# Patient Record
Sex: Male | Born: 1985 | Race: White | Hispanic: No | Marital: Single | Smoking: Current every day smoker
Health system: Southern US, Community
[De-identification: ages and names within clinical notes are randomized; demographics above are authoritative.]

## PROBLEM LIST (undated history)

## (undated) DIAGNOSIS — I10 Essential (primary) hypertension: Secondary | ICD-10-CM

---

## 2003-01-21 ENCOUNTER — Emergency Department (HOSPITAL_COMMUNITY): Admission: EM | Admit: 2003-01-21 | Discharge: 2003-01-21 | Payer: Self-pay | Admitting: Emergency Medicine

## 2003-01-28 ENCOUNTER — Emergency Department (HOSPITAL_COMMUNITY): Admission: EM | Admit: 2003-01-28 | Discharge: 2003-01-28 | Payer: Self-pay | Admitting: Emergency Medicine

## 2004-03-19 ENCOUNTER — Ambulatory Visit (HOSPITAL_COMMUNITY): Admission: RE | Admit: 2004-03-19 | Discharge: 2004-03-19 | Payer: Self-pay | Admitting: Specialist

## 2005-01-01 ENCOUNTER — Ambulatory Visit (HOSPITAL_COMMUNITY): Admission: RE | Admit: 2005-01-01 | Discharge: 2005-01-01 | Payer: Self-pay | Admitting: Specialist

## 2009-01-06 ENCOUNTER — Encounter: Admission: RE | Admit: 2009-01-06 | Discharge: 2009-01-06 | Payer: Self-pay | Admitting: Otolaryngology

## 2011-01-15 ENCOUNTER — Other Ambulatory Visit: Payer: Self-pay | Admitting: Family Medicine

## 2011-01-15 DIAGNOSIS — G43909 Migraine, unspecified, not intractable, without status migrainosus: Secondary | ICD-10-CM

## 2011-01-22 ENCOUNTER — Ambulatory Visit
Admission: RE | Admit: 2011-01-22 | Discharge: 2011-01-22 | Disposition: A | Discharge status: Home / Self Care | Payer: Self-pay | Source: Ambulatory Visit | Attending: Family Medicine | Admitting: Family Medicine

## 2011-01-22 DIAGNOSIS — G43909 Migraine, unspecified, not intractable, without status migrainosus: Secondary | ICD-10-CM

## 2011-02-09 ENCOUNTER — Emergency Department (HOSPITAL_COMMUNITY)
Admission: EM | Admit: 2011-02-09 | Discharge: 2011-02-10 | Disposition: A | Discharge status: Home / Self Care | Payer: 59 | Attending: Emergency Medicine | Admitting: Emergency Medicine

## 2011-02-09 ENCOUNTER — Encounter: Payer: Self-pay | Admitting: Adult Health

## 2011-02-09 ENCOUNTER — Other Ambulatory Visit: Payer: Self-pay

## 2011-02-09 DIAGNOSIS — F17200 Nicotine dependence, unspecified, uncomplicated: Secondary | ICD-10-CM | POA: Insufficient documentation

## 2011-02-09 DIAGNOSIS — R51 Headache: Secondary | ICD-10-CM | POA: Insufficient documentation

## 2011-02-09 DIAGNOSIS — D72829 Elevated white blood cell count, unspecified: Secondary | ICD-10-CM | POA: Insufficient documentation

## 2011-02-09 DIAGNOSIS — R599 Enlarged lymph nodes, unspecified: Secondary | ICD-10-CM | POA: Insufficient documentation

## 2011-02-09 DIAGNOSIS — R591 Generalized enlarged lymph nodes: Secondary | ICD-10-CM

## 2011-02-09 LAB — DIFFERENTIAL
Band Neutrophils: 0 % (ref 0–10)
Basophils Absolute: 1 10*3/uL — ABNORMAL HIGH (ref 0.0–0.1)
Basophils Relative: 5 % — ABNORMAL HIGH (ref 0–1)
Eosinophils Absolute: 0 10*3/uL (ref 0.0–0.7)
Eosinophils Relative: 0 % (ref 0–5)
Lymphocytes Relative: 64 % — ABNORMAL HIGH (ref 12–46)
Lymphs Abs: 13.1 10*3/uL — ABNORMAL HIGH (ref 0.7–4.0)
Metamyelocytes Relative: 1 %
Monocytes Relative: 16 % — ABNORMAL HIGH (ref 3–12)
Myelocytes: 0 %
Neutro Abs: 3.1 10*3/uL (ref 1.7–7.7)
Neutrophils Relative %: 14 % — ABNORMAL LOW (ref 43–77)
Promyelocytes Absolute: 0 %

## 2011-02-09 LAB — CBC
HCT: 46.6 % (ref 39.0–52.0)
Hemoglobin: 15.7 g/dL (ref 13.0–17.0)
MCH: 29.2 pg (ref 26.0–34.0)
MCHC: 33.7 g/dL (ref 30.0–36.0)
MCV: 86.6 fL (ref 78.0–100.0)
RBC: 5.38 MIL/uL (ref 4.22–5.81)
RDW: 14.7 % (ref 11.5–15.5)

## 2011-02-09 LAB — BASIC METABOLIC PANEL
BUN: 8 mg/dL (ref 6–23)
CO2: 25 mEq/L (ref 19–32)
Calcium: 9.1 mg/dL (ref 8.4–10.5)
Chloride: 101 mEq/L (ref 96–112)
Creatinine, Ser: 1 mg/dL (ref 0.50–1.35)
GFR calc Af Amer: >90 mL/min (ref >90)
GFR calc non Af Amer: >90 mL/min (ref >90)
Glucose, Bld: 110 mg/dL — ABNORMAL HIGH (ref 70–99)
Potassium: 4.2 mEq/L (ref 3.5–5.1)
Sodium: 136 mEq/L (ref 135–145)

## 2011-02-09 MED ORDER — DEXAMETHASONE SODIUM PHOSPHATE 10 MG/ML IJ SOLN: 10.0000 mg | Freq: Once | INTRAMUSCULAR | Status: DC

## 2011-02-09 MED ORDER — DEXAMETHASONE SODIUM PHOSPHATE 10 MG/ML IJ SOLN
10.0000 mg | Freq: Once | INTRAMUSCULAR | Status: AC
  Administered 2011-02-10: 10 mg via INTRAMUSCULAR
  Filled 2011-02-09: qty 1

## 2011-02-09 MED ORDER — DIPHENHYDRAMINE HCL 25 MG PO CAPS
25.0000 mg | ORAL_CAPSULE | Freq: Once | ORAL | Status: AC
  Administered 2011-02-10: 25 mg via ORAL
  Filled 2011-02-09: qty 1

## 2011-02-09 NOTE — ED Notes (Signed)
Reports migraine that began 3 weeks ago, Sensitive to light and sound. C/O neck pain.  Pt states, "This migraine does not go away, worse headache of his life. States it eases a bit and then comes back and is the worse headache ever." Alert and oriented answers all questions appropriately, follows commands.

## 2011-02-09 NOTE — ED Notes (Signed)
Pt also c/o dark urine, small bumps on the back of his neck and chest pain on the left side of chest that radiates to the back and is associated with nausea.

## 2011-02-10 ENCOUNTER — Emergency Department (HOSPITAL_COMMUNITY): Payer: 59

## 2011-02-10 ENCOUNTER — Encounter (HOSPITAL_COMMUNITY): Payer: Self-pay | Admitting: Emergency Medicine

## 2011-02-10 LAB — URINALYSIS, ROUTINE W REFLEX MICROSCOPIC
Hgb urine dipstick: NEGATIVE
Nitrite: POSITIVE — AB
Protein, ur: NEGATIVE mg/dL
Specific Gravity, Urine: 1.026 (ref 1.005–1.030)
Urobilinogen, UA: 2 mg/dL — ABNORMAL HIGH (ref 0.0–1.0)

## 2011-02-10 LAB — URINE MICROSCOPIC-ADD ON

## 2011-02-10 MED ORDER — SODIUM CHLORIDE 0.9 % IV BOLUS (SEPSIS)
1000.0000 mL | Freq: Once | INTRAVENOUS | Status: AC
  Administered 2011-02-10: 1000 mL via INTRAVENOUS

## 2011-02-10 MED ORDER — ONDANSETRON HCL 4 MG/2ML IJ SOLN
4.0000 mg | Freq: Once | INTRAMUSCULAR | Status: AC
  Administered 2011-02-10: 4 mg via INTRAVENOUS
  Filled 2011-02-10: qty 2

## 2011-02-10 MED ORDER — HYDROMORPHONE HCL PF 1 MG/ML IJ SOLN
1.0000 mg | Freq: Once | INTRAMUSCULAR | Status: AC
  Administered 2011-02-10: 1 mg via INTRAVENOUS
  Filled 2011-02-10: qty 1

## 2011-02-10 MED ORDER — METOCLOPRAMIDE HCL 5 MG/ML IJ SOLN
10.0000 mg | Freq: Once | INTRAMUSCULAR | Status: AC
  Administered 2011-02-10: 10 mg via INTRAVENOUS
  Filled 2011-02-10: qty 2

## 2011-02-10 NOTE — ED Provider Notes (Signed)
History     CSN: [id number] Arrival date & time: 02/09/2011  8:47 PM   First MD Initiated Contact with Patient 02/10/11 0033      Chief Complaint  Patient presents with  . Migraine    Patient is a 25 y.o. male presenting with migraine.  Migraine Associated symptoms include coughing, fatigue and headaches. Pertinent negatives include no abdominal pain, chest pain, chills, nausea, numbness, rash or vomiting.   History provided by patient and family. Patient presents with complaints of persistent migraine headache for the past 3 weeks. Patient has history of migraines and normally takes atenolol and Flexeril for this. Patient states he has been taking this regularly without improvement of symptoms. Pain is worse with bright lights and loud noise. Patient also reports feeling generalized fatigue and flulike symptoms. Patient reports a mild cough. He also reports having some bumps under the skin to the posterior neck that have been sore. These have been present for the past week. Patient has associated decreased appetite. Patient denies nausea or vomiting. No nasal congestion or sinus pressure. No ear pain or hearing changes.    Past Medical History  Diagnosis Date  . Migraine   . Glaucoma     History reviewed. No pertinent past surgical history.  History reviewed. No pertinent family history.  History  Substance Use Topics  . Smoking status: Current Everyday Smoker  . Smokeless tobacco: Not on file  . Alcohol Use: No      Review of Systems  Constitutional: Positive for fatigue. Negative for chills.  Respiratory: Positive for cough.   Cardiovascular: Negative for chest pain.  Gastrointestinal: Negative for nausea, vomiting, abdominal pain, diarrhea and constipation.  Skin: Negative for rash.  Neurological: Positive for headaches. Negative for dizziness and numbness.  Psychiatric/Behavioral: Negative for confusion.  All other systems reviewed and are  negative.    Allergies  Review of patient's allergies indicates no known allergies.  Home Medications   Current Outpatient Rx  Name Route Sig Dispense Refill  . ATENOLOL 25 MG PO TABS Oral Take 25 mg by mouth 2 (two) times daily.      . CYCLOBENZAPRINE HCL 10 MG PO TABS Oral Take 10 mg by mouth 3 (three) times daily as needed. Pain.       BP 114/65  Pulse 86  Temp(Src) 99 F (37.2 C) (Oral)  Resp 16  SpO2 98%  Physical Exam  Nursing note and vitals reviewed. Constitutional: He is oriented to person, place, and time. He appears well-developed and well-nourished. No distress.       Uncomfortable appearing  HENT:  Head: Normocephalic and atraumatic.  Right Ear: External ear normal.  Left Ear: External ear normal.  Mouth/Throat: Oropharynx is clear and moist.  Eyes: Conjunctivae and EOM are normal. Pupils are equal, round, and reactive to light. No scleral icterus.  Neck: Full passive range of motion without pain. No erythema present. No Brudzinski's sign and no Kernig's sign noted.       Bilateral posterior chain cervical adenopathy with mild tenderness  Cardiovascular: Normal rate and regular rhythm.   No murmur heard. Pulmonary/Chest: Effort normal and breath sounds normal. He has no wheezes. He has no rales.  Abdominal: Soft. Bowel sounds are normal. There is no tenderness. There is CVA tenderness. There is no rebound and no guarding.  Musculoskeletal: Normal range of motion. He exhibits no edema and no tenderness.  Neurological: He is alert and oriented to person, place, and time. He has normal strength. No  cranial nerve deficit or sensory deficit. Coordination and gait normal.  Skin: Skin is warm. No rash noted. No erythema.  Psychiatric: He has a normal mood and affect.    ED Course  Procedures (including critical care time)  Labs Reviewed  CBC - Abnormal; Notable for the following:    WBC 20.5 (*)    All other components within normal limits  DIFFERENTIAL -  Abnormal; Notable for the following:    Neutrophils Relative 14 (*)    Lymphocytes Relative 64 (*)    Monocytes Relative 16 (*)    Basophils Relative 5 (*)    Lymphs Abs 13.1 (*)    Monocytes Absolute 3.3 (*)    Basophils Absolute 1.0 (*)    All other components within normal limits  BASIC METABOLIC PANEL - Abnormal; Notable for the following:    Glucose, Bld 110 (*)    All other components within normal limits  URINALYSIS, ROUTINE W REFLEX MICROSCOPIC - Abnormal; Notable for the following:    Color, Urine ORANGE (*) BIOCHEMICALS MAY BE AFFECTED BY COLOR   Appearance CLOUDY (*)    Bilirubin Urine LARGE (*)    Ketones, ur TRACE (*)    Urobilinogen, UA 2.0 (*)    Nitrite POSITIVE (*)    Leukocytes, UA SMALL (*)    All other components within normal limits  URINE MICROSCOPIC-ADD ON - Abnormal; Notable for the following:    Bacteria, UA MANY (*)    Crystals CA OXALATE CRYSTALS (*)    All other components within normal limits  URINE CULTURE  PATHOLOGIST SMEAR REVIEW   Results for orders placed during the hospital encounter of 02/09/11  CBC      Component Value Range   WBC 20.5 (*) 4.0 - 10.5 (K/uL)   RBC 5.38  4.22 - 5.81 (MIL/uL)   Hemoglobin 15.7  13.0 - 17.0 (g/dL)   HCT 40.9  81.1 - 91.4 (%)   MCV 86.6  78.0 - 100.0 (fL)   MCH 29.2  26.0 - 34.0 (pg)   MCHC 33.7  30.0 - 36.0 (g/dL)   RDW 78.2  95.6 - 21.3 (%)   Platelets 219  150 - 400 (K/uL)  DIFFERENTIAL      Component Value Range   Neutrophils Relative 14 (*) 43 - 77 (%)   Lymphocytes Relative 64 (*) 12 - 46 (%)   Monocytes Relative 16 (*) 3 - 12 (%)   Eosinophils Relative 0  0 - 5 (%)   Basophils Relative 5 (*) 0 - 1 (%)   Band Neutrophils 0  0 - 10 (%)   Metamyelocytes Relative 1     Myelocytes 0     Promyelocytes Absolute 0     Blasts 0     nRBC 0  0 (/100 WBC)   Neutro Abs 3.1  1.7 - 7.7 (K/uL)   Lymphs Abs 13.1 (*) 0.7 - 4.0 (K/uL)   Monocytes Absolute 3.3 (*) 0.1 - 1.0 (K/uL)   Eosinophils Absolute 0.0   0.0 - 0.7 (K/uL)   Basophils Absolute 1.0 (*) 0.0 - 0.1 (K/uL)   WBC Morphology VACUOLATED NEUTROPHILS     Smear Review LARGE PLATELETS PRESENT    BASIC METABOLIC PANEL      Component Value Range   Sodium 136  135 - 145 (mEq/L)   Potassium 4.2  3.5 - 5.1 (mEq/L)   Chloride 101  96 - 112 (mEq/L)   CO2 25  19 - 32 (mEq/L)   Glucose, Bld  110 (*) 70 - 99 (mg/dL)   BUN 8  6 - 23 (mg/dL)   Creatinine, Ser 1.19  0.50 - 1.35 (mg/dL)   Calcium 9.1  8.4 - 14.7 (mg/dL)   GFR calc non Af Amer >90  >90 (mL/min)   GFR calc Af Amer >90  >90 (mL/min)  URINALYSIS, ROUTINE W REFLEX MICROSCOPIC      Component Value Range   Color, Urine ORANGE (*) YELLOW    Appearance CLOUDY (*) CLEAR    Specific Gravity, Urine 1.026  1.005 - 1.030    pH 5.5  5.0 - 8.0    Glucose, UA NEGATIVE  NEGATIVE (mg/dL)   Hgb urine dipstick NEGATIVE  NEGATIVE    Bilirubin Urine LARGE (*) NEGATIVE    Ketones, ur TRACE (*) NEGATIVE (mg/dL)   Protein, ur NEGATIVE  NEGATIVE (mg/dL)   Urobilinogen, UA 2.0 (*) 0.0 - 1.0 (mg/dL)   Nitrite POSITIVE (*) NEGATIVE    Leukocytes, UA SMALL (*) NEGATIVE   URINE MICROSCOPIC-ADD ON      Component Value Range   Squamous Epithelial / LPF RARE  RARE    WBC, UA 3-6  <3 (WBC/hpf)   RBC / HPF 0-2  <3 (RBC/hpf)   Bacteria, UA MANY (*) RARE    Crystals CA OXALATE CRYSTALS (*) NEGATIVE    Urine-Other MUCOUS PRESENT        Ct Abdomen Pelvis Wo Contrast  02/10/2011  *RADIOLOGY REPORT*  Clinical Data: Left flank pain and nausea; fever.  Leukocytosis. White blood cells and red blood cells in the urine.  CT ABDOMEN AND PELVIS WITHOUT CONTRAST  Technique:  Multidetector CT imaging of the abdomen and pelvis was performed following the standard protocol without intravenous contrast.  Comparison: None.  Findings: Bibasilar atelectasis is noted.  The liver is unremarkable in appearance.  The spleen is mildly enlarged, measuring 14.0 cm in length.  The gallbladder is within normal limits.  The pancreas  and right adrenal gland are unremarkable.  There is a 2.0 cm mass arising at the left adrenal gland; its attenuation is too high to characterize as an adrenal adenoma on this study.  Adrenal protocol CT or MRI could be considered for further evaluation.  Minimal mesenteric haziness is noted adjacent to the pancreas; this is nonspecific, as no definite pancreatic inflammation is seen.  The kidneys are unremarkable in appearance.  There is no evidence of hydronephrosis.  No renal or ureteral stones are seen.  No perinephric stranding is appreciated.  No free fluid is identified.  The small bowel is unremarkable in appearance.  The stomach is within normal limits.  No acute vascular abnormalities are seen.  The appendix is normal in caliber, without evidence for appendicitis.  The colon is partially filled with air and stool, and is unremarkable in appearance.  The bladder is mildly distended and within normal limits; there is no definite evidence for cystitis.  The prostate is unremarkable in appearance.  No inguinal lymphadenopathy is seen.  No acute osseous abnormalities are identified.  IMPRESSION:  1.  Kidneys unremarkable in appearance; no evidence of hydronephrosis.  No evidence for pyelonephritis.  Bladder grossly unremarkable in appearance. 2.  Minimal nonspecific mesenteric haziness noted adjacent to the pancreas; no definite pancreatic inflammation seen to suggest pancreatitis. 3.  Mild splenomegaly. 4.  2.0 cm mass noted arising at the left adrenal gland; adrenal protocol CT or MRI would be helpful for further evaluation, when and as deemed clinically appropriate. 5.  Bibasilar atelectasis noted.  Original Report Authenticated By: Tonia Ghent, M.D.   Dg Chest 2 View  02/10/2011  *RADIOLOGY REPORT*  Clinical Data: Fever and cough for 3 weeks.  CHEST - 2 VIEW  Comparison: None.  Findings: The lungs are well-aerated.  Mild bilateral atelectasis is noted, more prominent on the left.  There is no evidence  of pleural effusion or pneumothorax.  The heart is normal in size; the mediastinal contour is within normal limits.  No acute osseous abnormalities are seen.  IMPRESSION: Mild bilateral atelectasis noted, more prominent on the left.  Original Report Authenticated By: Tonia Ghent, M.D.     1. Headache   2. Lymphadenopathy   3. Leukocytosis       MDM  Patient seen and evaluated. He is in no acute distress. Patient with headache symptoms the past 3 weeks. Patient does not appear toxic  Patient seen and discussed with attending physician, Dr. Adriana Simas. Patient with no signs for meningitis. Patient has multiple visits to the ED. Patient reports symptoms are improved. Since labs today do show elevated white blood cell count. Patient's urine was sent for culture. The patient has been instructed to followup with his primary care provider for repeat lab test next week.      Angus Seller, [place] 02/10/11 [phone]

## 2011-02-10 NOTE — ED Provider Notes (Signed)
Medical screening examination/treatment/procedure(s) were conducted as a shared visit with non-physician practitioner(s) and myself.  I personally evaluated the patient during the encounter. No neuro deficits. No meningeal signs.  Lymphocyte predominant leukocytosis noted.  Will culture urine.  Donnetta Hutching, MD 02/10/11 [phone]

## 2011-02-10 NOTE — ED Notes (Signed)
Patient refused rectal temp made MD, and RN aware

## 2011-02-11 LAB — URINE CULTURE
Colony Count: NO GROWTH
Culture  Setup Time: 201211140921

## 2011-02-11 LAB — PATHOLOGIST SMEAR REVIEW

## 2011-10-14 ENCOUNTER — Ambulatory Visit (INDEPENDENT_AMBULATORY_CARE_PROVIDER_SITE_OTHER): Payer: 59 | Admitting: Emergency Medicine

## 2011-10-14 VITALS — BP 110/79 | HR 104 | Temp 98.4°F | Resp 18 | Ht 69.0 in | Wt 201.0 lb

## 2011-10-14 DIAGNOSIS — R1110 Vomiting, unspecified: Secondary | ICD-10-CM

## 2011-10-14 DIAGNOSIS — R197 Diarrhea, unspecified: Secondary | ICD-10-CM

## 2011-10-14 DIAGNOSIS — R109 Unspecified abdominal pain: Secondary | ICD-10-CM

## 2011-10-14 LAB — POCT CBC
Granulocyte percent: 83.3 %G — AB (ref 37–80)
HCT, POC: 50.8 % (ref 43.5–53.7)
Hemoglobin: 15.9 g/dL (ref 14.1–18.1)
Lymph, poc: 1.3 (ref 0.6–3.4)
MCH, POC: 28.2 pg (ref 27–31.2)
MCHC: 31.3 g/dL — AB (ref 31.8–35.4)
MID (cbc): 0.4 (ref 0–0.9)
MPV: 11.7 fL (ref 0–99.8)
POC Granulocyte: 8.2 — AB (ref 2–6.9)
POC LYMPH PERCENT: 12.7 %L (ref 10–50)
Platelet Count, POC: 265 10*3/uL (ref 142–424)
RBC: 5.64 M/uL (ref 4.69–6.13)
RDW, POC: 13.7 %
WBC: 9.9 10*3/uL (ref 4.6–10.2)

## 2011-10-14 LAB — POCT UA - MICROSCOPIC ONLY
Crystals, Ur, HPF, POC: NEGATIVE
Mucus, UA: POSITIVE
Yeast, UA: NEGATIVE

## 2011-10-14 LAB — POCT URINALYSIS DIPSTICK
Glucose, UA: NEGATIVE
Leukocytes, UA: NEGATIVE
Nitrite, UA: NEGATIVE
Protein, UA: 30
pH, UA: 5.5

## 2011-10-14 LAB — IFOBT (OCCULT BLOOD): IFOBT: NEGATIVE

## 2011-10-14 MED ORDER — ONDANSETRON 4 MG PO TBDP
8.0000 mg | ORAL_TABLET | Freq: Once | ORAL | Status: AC
  Administered 2011-10-14: 8 mg via ORAL

## 2011-10-14 MED ORDER — ONDANSETRON 8 MG PO TBDP: 8.0000 mg | ORAL_TABLET | Freq: Three times a day (TID) | ORAL | Status: AC | PRN

## 2011-10-14 NOTE — Patient Instructions (Addendum)
Diarrhea Infections caused by germs (bacterial) or a virus commonly cause diarrhea. Your caregiver has determined that with time, rest and fluids, the diarrhea should improve. In general, eat normally while drinking more water than usual. Although water may prevent dehydration, it does not contain salt and minerals (electrolytes). Broths, weak tea without caffeine and oral rehydration solutions (ORS) replace fluids and electrolytes. Small amounts of fluids should be taken frequently. Large amounts at one time may not be tolerated. Plain water may be harmful in infants and the elderly. Oral rehydrating solutions (ORS) are available at pharmacies and grocery stores. ORS replace water and important electrolytes in proper proportions. Sports drinks are not as effective as ORS and may be harmful due to sugars worsening diarrhea.  ORS is especially recommended for use in children with diarrhea. As a general guideline for children, replace any new fluid losses from diarrhea and/or vomiting with ORS as follows:   If your child weighs 22 pounds or under (10 kg or less), give 60-120 mL ( -  cup or 2 - 4 ounces) of ORS for each episode of diarrheal stool or vomiting episode.   If your child weighs more than 22 pounds (more than 10 kgs), give 120-240 mL ( - 1 cup or 4 - 8 ounces) of ORS for each diarrheal stool or episode of vomiting.   While correcting for dehydration, children should eat normally. However, foods high in sugar should be avoided because this may worsen diarrhea. Large amounts of carbonated soft drinks, juice, gelatin desserts and other highly sugared drinks should be avoided.   After correction of dehydration, other liquids that are appealing to the child may be added. Children should drink small amounts of fluids frequently and fluids should be increased as tolerated. Children should drink enough fluids to keep urine clear or pale yellow.   Adults should eat normally while drinking more fluids  than usual. Drink small amounts of fluids frequently and increase as tolerated. Drink enough fluids to keep urine clear or pale yellow. Broths, weak decaffeinated tea, lemon lime soft drinks (allowed to go flat) and ORS replace fluids and electrolytes.   Avoid:   Carbonated drinks.   Juice.   Extremely hot or cold fluids.   Caffeine drinks.   Fatty, greasy foods.   Alcohol.   Tobacco.   Too much intake of anything at one time.   Gelatin desserts.   Probiotics are active cultures of beneficial bacteria. They may lessen the amount and number of diarrheal stools in adults. Probiotics can be found in yogurt with active cultures and in supplements.   Wash hands well to avoid spreading bacteria and virus.   Anti-diarrheal medications are not recommended for infants and children.   Only take over-the-counter or prescription medicines for pain, discomfort or fever as directed by your caregiver. Do not give aspirin to children because it may cause Reye's Syndrome.   For adults, ask your caregiver if you should continue all prescribed and over-the-counter medicines.   If your caregiver has given you a follow-up appointment, it is very important to keep that appointment. Not keeping the appointment could result in a chronic or permanent injury, and disability. If there is any problem keeping the appointment, you must call back to this facility for assistance.  SEEK IMMEDIATE MEDICAL CARE IF:   You or your child is unable to keep fluids down or other symptoms or problems become worse in spite of treatment.   Vomiting or diarrhea develops and becomes persistent.     There is vomiting of blood or bile (green material).   There is blood in the stool or the stools are black and tarry.   There is no urine output in 6-8 hours or there is only a small amount of very dark urine.   Abdominal pain develops, increases or localizes.   You have a fever.   Your baby is older than 3 months with a  rectal temperature of 102 F (38.9 C) or higher.   Your baby is 65 months old or younger with a rectal temperature of 100.4 F (38 C) or higher.   You or your child develops excessive weakness, dizziness, fainting or extreme thirst.   You or your child develops a rash, stiff neck, severe headache or become irritable or sleepy and difficult to awaken.  MAKE SURE YOU:   Understand these instructions.   Will watch your condition.   Will get help right away if you are not doing well or get worse.  Document Released: 03/05/2002 Document Revised: 03/04/2011 Document Reviewed: 01/20/2009 [facility] Patient Information 2012 [place].Nausea and Vomiting Nausea is a sick feeling that often comes before throwing up (vomiting). Vomiting is a reflex where stomach contents come out of your mouth. Vomiting can cause severe loss of body fluids (dehydration). Children and elderly adults can become dehydrated quickly, especially if they also have diarrhea. Nausea and vomiting are symptoms of a condition or disease. It is important to find the cause of your symptoms. CAUSES   Direct irritation of the stomach lining. This irritation can result from increased acid production (gastroesophageal reflux disease), infection, food poisoning, taking certain medicines (such as nonsteroidal anti-inflammatory drugs), alcohol use, or tobacco use.   Signals from the brain.These signals could be caused by a headache, heat exposure, an inner ear disturbance, increased pressure in the brain from injury, infection, a tumor, or a concussion, pain, emotional stimulus, or metabolic problems.   An obstruction in the gastrointestinal tract (bowel obstruction).   Illnesses such as diabetes, hepatitis, gallbladder problems, appendicitis, kidney problems, cancer, sepsis, atypical symptoms of a heart attack, or eating disorders.   Medical treatments such as chemotherapy and radiation.   Receiving medicine that makes you sleep  (general anesthetic) during surgery.  DIAGNOSIS Your caregiver may ask for tests to be done if the problems do not improve after a few days. Tests may also be done if symptoms are severe or if the reason for the nausea and vomiting is not clear. Tests may include:  Urine tests.   Blood tests.   Stool tests.   Cultures (to look for evidence of infection).   X-rays or other imaging studies.  Test results can help your caregiver make decisions about treatment or the need for additional tests. TREATMENT You need to stay well hydrated. Drink frequently but in small amounts.You may wish to drink water, sports drinks, clear broth, or eat frozen ice pops or gelatin dessert to help stay hydrated.When you eat, eating slowly may help prevent nausea.There are also some antinausea medicines that may help prevent nausea. HOME CARE INSTRUCTIONS   Take all medicine as directed by your caregiver.   If you do not have an appetite, do not force yourself to eat. However, you must continue to drink fluids.   If you have an appetite, eat a normal diet unless your caregiver tells you differently.   Eat a variety of complex carbohydrates (rice, wheat, potatoes, bread), lean meats, yogurt, fruits, and vegetables.   Avoid high-fat foods because they  are more difficult to digest.   Drink enough water and fluids to keep your urine clear or pale yellow.   If you are dehydrated, ask your caregiver for specific rehydration instructions. Signs of dehydration may include:   Severe thirst.   Dry lips and mouth.   Dizziness.   Dark urine.   Decreasing urine frequency and amount.   Confusion.   Rapid breathing or pulse.  SEEK IMMEDIATE MEDICAL CARE IF:   You have blood or brown flecks (like coffee grounds) in your vomit.   You have black or bloody stools.   You have a severe headache or stiff neck.   You are confused.   You have severe abdominal pain.   You have chest pain or trouble  breathing.   You do not urinate at least once every 8 hours.   You develop cold or clammy skin.   You continue to vomit for longer than 24 to 48 hours.   You have a fever.  MAKE SURE YOU:   Understand these instructions.   Will watch your condition.   Will get help right away if you are not doing well or get worse.  Document Released: 03/15/2005 Document Revised: 03/04/2011 Document Reviewed: 08/12/2010 [facility] Patient Information 2012 [place], [place]. Please return to clinic tomorrow to check with Dr. Dallas Schimke if you continue to have symptoms

## 2011-10-14 NOTE — Progress Notes (Signed)
  Subjective:    Patient ID: Sean Page, male    DOB: 1985-07-24, 26 y.o.   MRN: [id number]  HPI patient states she started feeling bad yesterday. He was able to make it through the day working and then last night he started vomiting. That was followed by loop frequent loose stools. He also noticed some blood in his stools. He has had shaking chills and diaphoresis last night and again today. He vomited again this morning. 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 He states he is very careful about exposure to    Review of Systems     Objective:   Physical Exam  Constitutional: He appears well-developed and well-nourished.  HENT:  Right Ear: External ear normal.  Left Ear: External ear normal.  Eyes: Pupils are equal, round, and reactive to light.  Neck: Normal range of motion. Neck supple. No thyromegaly present.  Cardiovascular: Normal rate and regular rhythm.   Pulmonary/Chest: Effort normal and breath sounds normal.  Abdominal:       There are bowel sounds present. There is mild tenderness deep in the left lower abdomen. There is no rebound noted.    Results for orders placed in visit on 10/14/11  POCT CBC      Component Value Range   WBC 9.9  4.6 - 10.2 K/uL   Lymph, poc 1.3  0.6 - 3.4   POC LYMPH PERCENT 12.7  10 - 50 %L   MID (cbc) 0.4  0 - 0.9   POC MID % 4.0  0 - 12 %M   POC Granulocyte 8.2 (*) 2 - 6.9   Granulocyte percent 83.3 (*) 37 - 80 %G   RBC 5.64  4.69 - 6.13 M/uL   Hemoglobin 15.9  14.1 - 18.1 g/dL   HCT, POC 08.6  57.8 - 53.7 %   MCV 90.1  80 - 97 fL   MCH, POC 28.2  27 - 31.2 pg   MCHC 31.3 (*) 31.8 - 35.4 g/dL   RDW, POC 46.9     Platelet Count, POC 265  142 - 424 K/uL   MPV 11.7  0 - 99.8 fL  POCT UA - MICROSCOPIC ONLY      Component Value Range   WBC, Ur, HPF, POC 0-2     RBC, urine, microscopic 2-3     Bacteria, U Microscopic trace     Mucus, UA positive     Epithelial cells, urine per micros  0-1     Crystals, Ur, HPF, POC neg     Casts, Ur, LPF, POC neg     Yeast, UA neg    POCT URINALYSIS DIPSTICK      Component Value Range   Color, UA amber     Clarity, UA clear     Glucose, UA neg     Bilirubin, UA small     Ketones, UA trace     Spec Grav, UA >=1.030     Blood, UA trace-intact     pH, UA 5.5     Protein, UA 30     Urobilinogen, UA 0.2     Nitrite, UA neg     Leukocytes, UA Negative          Assessment & Plan:    Symptoms most consistent with gastroenteritis. Will recheck tomorrow if not improving

## 2011-10-15 ENCOUNTER — Telehealth: Payer: Self-pay | Admitting: Emergency Medicine

## 2011-10-15 LAB — COMPREHENSIVE METABOLIC PANEL
ALT: 18 U/L (ref 0–53)
AST: 19 U/L (ref 0–37)
Albumin: 4.8 g/dL (ref 3.5–5.2)
Alkaline Phosphatase: 93 U/L (ref 39–117)
CO2: 20 mEq/L (ref 19–32)
Calcium: 9.7 mg/dL (ref 8.4–10.5)
Chloride: 103 mEq/L (ref 96–112)
Creat: 0.89 mg/dL (ref 0.50–1.35)
Potassium: 4.2 mEq/L (ref 3.5–5.3)
Sodium: 134 mEq/L — ABNORMAL LOW (ref 135–145)
Total Bilirubin: 0.9 mg/dL (ref 0.3–1.2)
Total Protein: 7.6 g/dL (ref 6.0–8.3)

## 2011-10-29 NOTE — Telephone Encounter (Signed)
No encounter occurred. Patient was only called to check on status.

## 2013-01-03 IMAGING — CR DG CHEST 2V
2 series · 2 of 2 positions shown · non-contrast
Comparison: None.

CLINICAL DATA: Fever and cough for 3 weeks.

CHEST - 2 VIEW

[w chest lat]
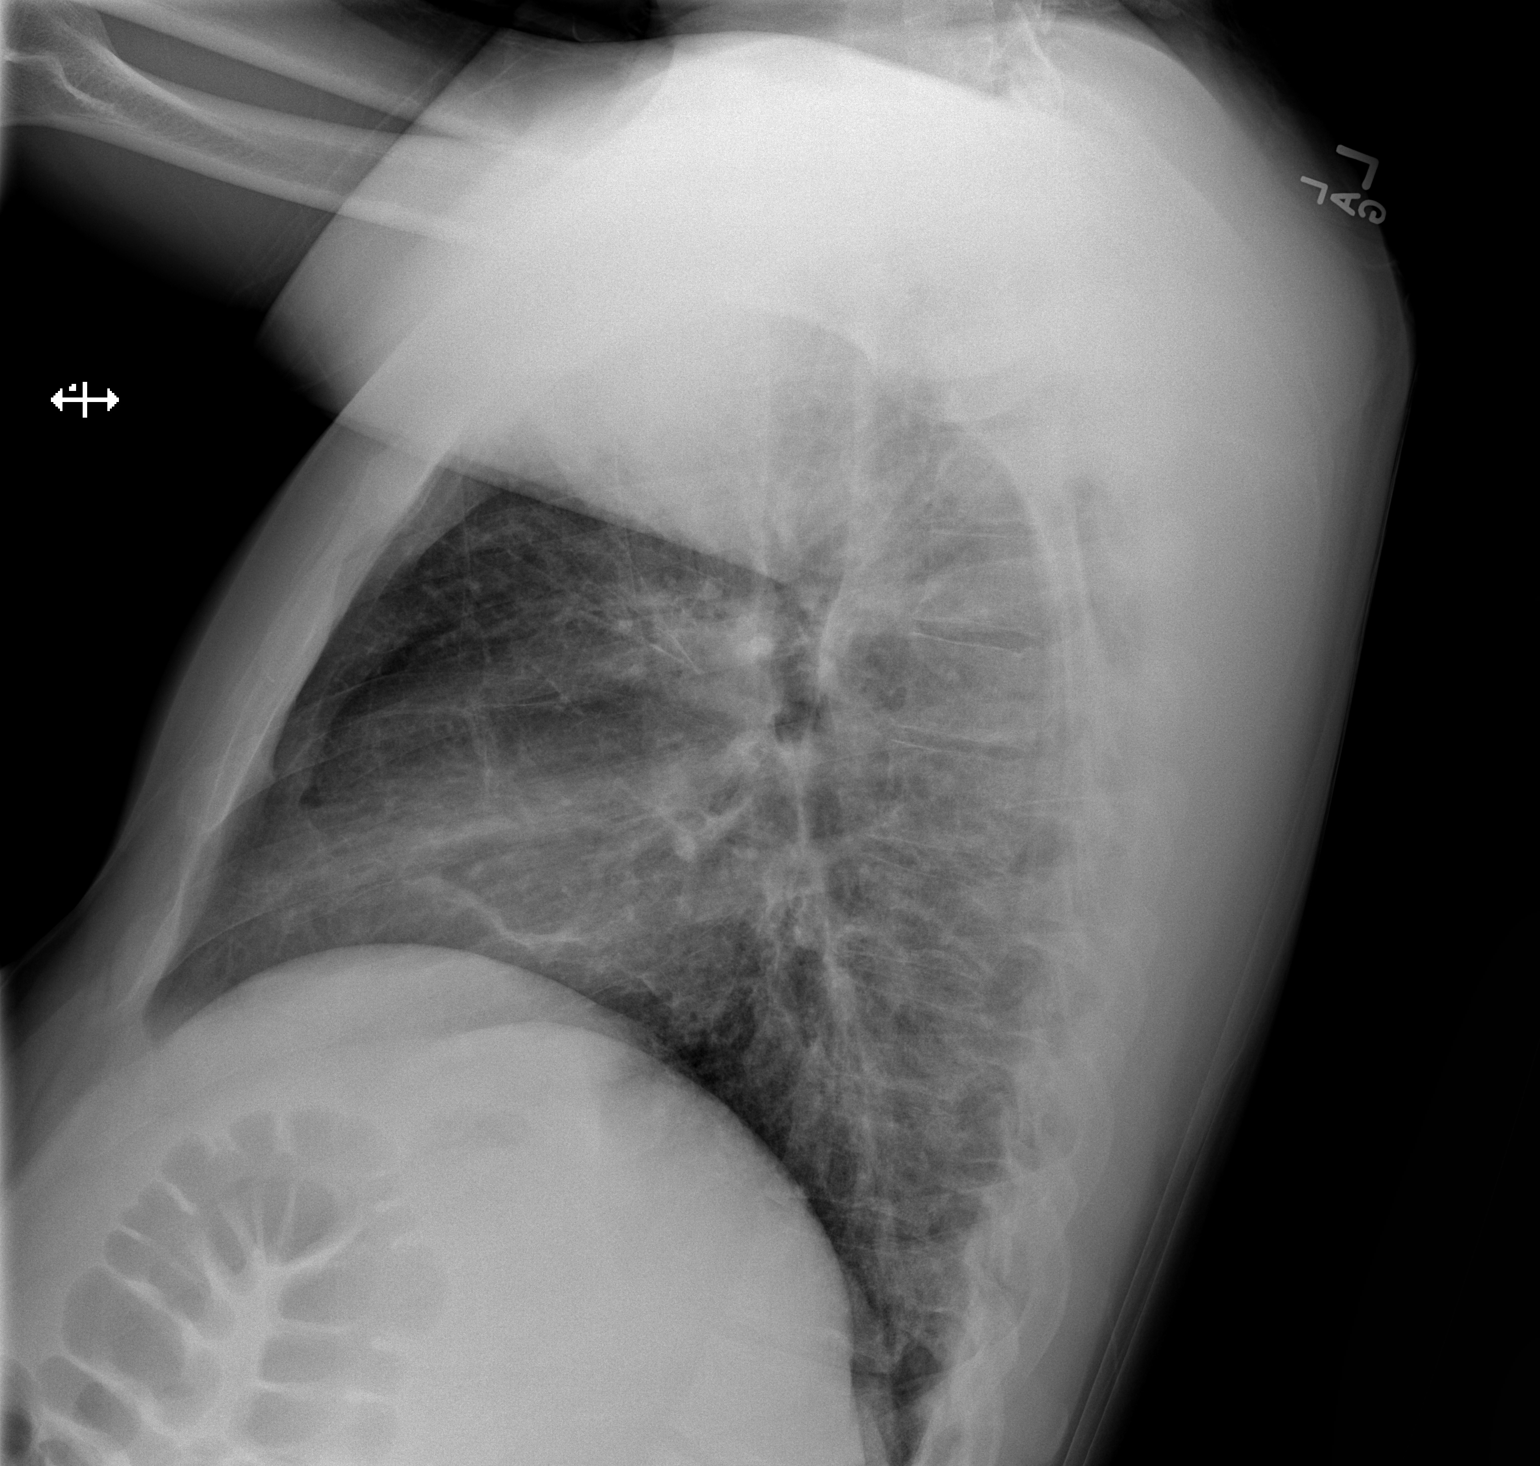

[x chest ap]
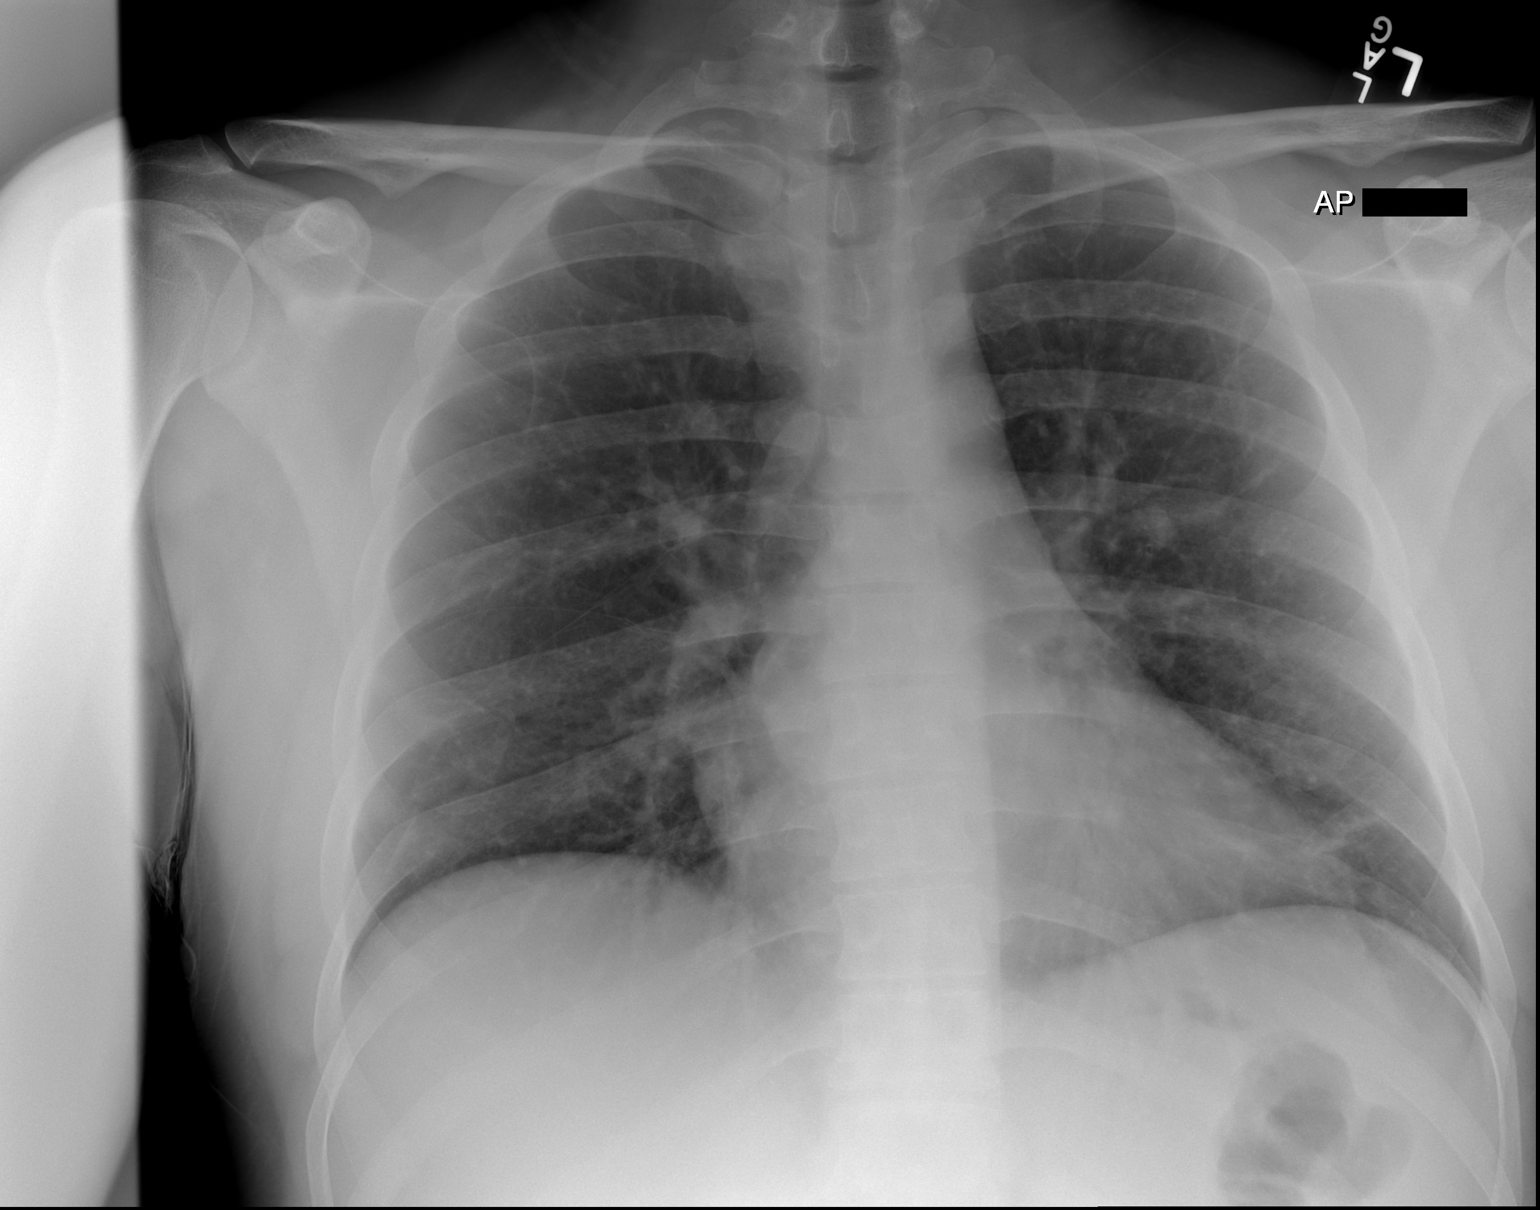

[2 of 2 positions shown; findings below may reference images not displayed]

FINDINGS: The lungs are well-aerated.  Mild bilateral atelectasis
is noted, more prominent on the left.  There is no evidence of
pleural effusion or pneumothorax.

The heart is normal in size; the mediastinal contour is within
normal limits.  No acute osseous abnormalities are seen.
IMPRESSION: Mild bilateral atelectasis noted, more prominent on the left.

## 2013-04-08 ENCOUNTER — Emergency Department (HOSPITAL_COMMUNITY): Admission: EM | Admit: 2013-04-08 | Discharge: 2013-04-08 | Discharge status: Against Medical Advice | Payer: 59

## 2013-07-18 DIAGNOSIS — Q150 Congenital glaucoma: Secondary | ICD-10-CM | POA: Insufficient documentation

## 2013-08-06 ENCOUNTER — Other Ambulatory Visit: Payer: Self-pay | Admitting: Internal Medicine

## 2013-08-07 ENCOUNTER — Other Ambulatory Visit: Payer: Self-pay | Admitting: Internal Medicine

## 2015-04-22 DIAGNOSIS — H1820 Unspecified corneal edema: Secondary | ICD-10-CM | POA: Insufficient documentation

## 2016-03-09 ENCOUNTER — Other Ambulatory Visit: Payer: Self-pay | Admitting: Psychiatry

## 2016-03-09 DIAGNOSIS — R2990 Unspecified symptoms and signs involving the nervous system: Secondary | ICD-10-CM

## 2016-04-05 ENCOUNTER — Ambulatory Visit
Admission: RE | Admit: 2016-04-05 | Discharge: 2016-04-05 | Disposition: A | Discharge status: Home / Self Care | Payer: 59 | Source: Ambulatory Visit | Attending: Psychiatry | Admitting: Psychiatry

## 2016-04-05 DIAGNOSIS — R2990 Unspecified symptoms and signs involving the nervous system: Secondary | ICD-10-CM

## 2016-04-05 DIAGNOSIS — R51 Headache: Secondary | ICD-10-CM | POA: Diagnosis not present

## 2016-04-05 MED ORDER — GADOBENATE DIMEGLUMINE 529 MG/ML IV SOLN
20.0000 mL | Freq: Once | INTRAVENOUS | Status: AC | PRN
  Administered 2016-04-05: 20 mL via INTRAVENOUS

## 2016-04-16 DIAGNOSIS — G43009 Migraine without aura, not intractable, without status migrainosus: Secondary | ICD-10-CM | POA: Diagnosis not present

## 2016-04-30 DIAGNOSIS — G43009 Migraine without aura, not intractable, without status migrainosus: Secondary | ICD-10-CM | POA: Diagnosis not present

## 2016-05-03 DIAGNOSIS — M9901 Segmental and somatic dysfunction of cervical region: Secondary | ICD-10-CM | POA: Diagnosis not present

## 2016-05-03 DIAGNOSIS — M5412 Radiculopathy, cervical region: Secondary | ICD-10-CM | POA: Diagnosis not present

## 2016-05-04 DIAGNOSIS — M9901 Segmental and somatic dysfunction of cervical region: Secondary | ICD-10-CM | POA: Diagnosis not present

## 2016-05-04 DIAGNOSIS — M5412 Radiculopathy, cervical region: Secondary | ICD-10-CM | POA: Diagnosis not present

## 2016-05-05 DIAGNOSIS — M9901 Segmental and somatic dysfunction of cervical region: Secondary | ICD-10-CM | POA: Diagnosis not present

## 2016-05-05 DIAGNOSIS — M5412 Radiculopathy, cervical region: Secondary | ICD-10-CM | POA: Diagnosis not present

## 2016-05-10 DIAGNOSIS — M5412 Radiculopathy, cervical region: Secondary | ICD-10-CM | POA: Diagnosis not present

## 2016-05-10 DIAGNOSIS — M9901 Segmental and somatic dysfunction of cervical region: Secondary | ICD-10-CM | POA: Diagnosis not present

## 2016-05-12 DIAGNOSIS — M9901 Segmental and somatic dysfunction of cervical region: Secondary | ICD-10-CM | POA: Diagnosis not present

## 2016-05-12 DIAGNOSIS — M5412 Radiculopathy, cervical region: Secondary | ICD-10-CM | POA: Diagnosis not present

## 2016-05-17 DIAGNOSIS — M9901 Segmental and somatic dysfunction of cervical region: Secondary | ICD-10-CM | POA: Diagnosis not present

## 2016-05-17 DIAGNOSIS — M5412 Radiculopathy, cervical region: Secondary | ICD-10-CM | POA: Diagnosis not present

## 2016-05-19 DIAGNOSIS — M9901 Segmental and somatic dysfunction of cervical region: Secondary | ICD-10-CM | POA: Diagnosis not present

## 2016-05-19 DIAGNOSIS — M5412 Radiculopathy, cervical region: Secondary | ICD-10-CM | POA: Diagnosis not present

## 2016-05-21 DIAGNOSIS — G43009 Migraine without aura, not intractable, without status migrainosus: Secondary | ICD-10-CM | POA: Diagnosis not present

## 2016-05-24 DIAGNOSIS — M9901 Segmental and somatic dysfunction of cervical region: Secondary | ICD-10-CM | POA: Diagnosis not present

## 2016-05-24 DIAGNOSIS — M5412 Radiculopathy, cervical region: Secondary | ICD-10-CM | POA: Diagnosis not present

## 2016-05-26 DIAGNOSIS — M5412 Radiculopathy, cervical region: Secondary | ICD-10-CM | POA: Diagnosis not present

## 2016-05-26 DIAGNOSIS — M9901 Segmental and somatic dysfunction of cervical region: Secondary | ICD-10-CM | POA: Diagnosis not present

## 2016-05-31 DIAGNOSIS — M9901 Segmental and somatic dysfunction of cervical region: Secondary | ICD-10-CM | POA: Diagnosis not present

## 2016-05-31 DIAGNOSIS — M5412 Radiculopathy, cervical region: Secondary | ICD-10-CM | POA: Diagnosis not present

## 2016-06-02 DIAGNOSIS — M5412 Radiculopathy, cervical region: Secondary | ICD-10-CM | POA: Diagnosis not present

## 2016-06-02 DIAGNOSIS — M9901 Segmental and somatic dysfunction of cervical region: Secondary | ICD-10-CM | POA: Diagnosis not present

## 2016-06-30 ENCOUNTER — Other Ambulatory Visit: Payer: Self-pay | Admitting: Nurse Practitioner

## 2016-06-30 ENCOUNTER — Ambulatory Visit
Admission: RE | Admit: 2016-06-30 | Discharge: 2016-06-30 | Disposition: A | Discharge status: Home / Self Care | Payer: Worker's Compensation | Source: Ambulatory Visit | Attending: Nurse Practitioner | Admitting: Nurse Practitioner

## 2016-06-30 DIAGNOSIS — M25519 Pain in unspecified shoulder: Secondary | ICD-10-CM

## 2016-07-12 ENCOUNTER — Ambulatory Visit (INDEPENDENT_AMBULATORY_CARE_PROVIDER_SITE_OTHER): Payer: Worker's Compensation | Admitting: Physician Assistant

## 2016-07-12 ENCOUNTER — Encounter (INDEPENDENT_AMBULATORY_CARE_PROVIDER_SITE_OTHER): Payer: Self-pay | Admitting: Physician Assistant

## 2016-07-12 ENCOUNTER — Ambulatory Visit (INDEPENDENT_AMBULATORY_CARE_PROVIDER_SITE_OTHER): Payer: Worker's Compensation

## 2016-07-12 VITALS — Ht 69.0 in | Wt 201.0 lb

## 2016-07-12 DIAGNOSIS — M542 Cervicalgia: Secondary | ICD-10-CM

## 2016-07-12 DIAGNOSIS — M5410 Radiculopathy, site unspecified: Secondary | ICD-10-CM

## 2016-07-12 DIAGNOSIS — M549 Dorsalgia, unspecified: Secondary | ICD-10-CM

## 2016-07-12 MED ORDER — METHYLPREDNISOLONE 4 MG PO TABS: ORAL_TABLET | ORAL | 0 refills | Status: DC

## 2016-07-12 MED ORDER — HYDROCODONE-ACETAMINOPHEN 5-325 MG PO TABS: 1.0000 | ORAL_TABLET | Freq: Four times a day (QID) | ORAL | 0 refills | Status: DC | PRN

## 2016-07-12 NOTE — Progress Notes (Signed)
Office Visit Note   Patient: Sean Page           Date of Birth: 23-Nov-1985           MRN: [id number] Visit Date: 07/12/2016              Requested by: No referring provider defined for this encounter. PCP: No PCP Per Patient   Assessment & Plan: Visit Diagnoses:  1. Neck pain   2. Acute upper back pain   3. Radicular leg pain     Plan: We will send her to physical therapy for range of motion, core strengthening, modalities, home exercise program and stretching. He'll follow with [place] in 2 weeks check his progress lack of. He's take NSAIDs while on the Medrol Dosepak. Placed him White duties at work no lifting greater than 10 pounds. We'll reevaluate his work status at follow-up  Follow-Up Instructions: Return in about 2 weeks (around 07/26/2016).   Orders:  Orders Placed This Encounter  Procedures  . XR Cervical Spine 2 or 3 views  . XR Thoracic Spine 2 View  . XR Lumbar Spine 2-3 Views  . Ambulatory referral to Physical Therapy   Meds ordered this encounter  Medications  . HYDROcodone-acetaminophen (NORCO) 5-325 MG tablet    Sig: Take 1 tablet by mouth every 6 (six) hours as needed for moderate pain. One to two tabs every 4-6 hours for pain    Dispense:  20 tablet    Refill:  0  . methylPREDNISolone (MEDROL) 4 MG tablet    Sig: Take as directed    Dispense:  21 tablet    Refill:  0      Procedures: No procedures performed   Clinical Data: No additional findings.   Subjective: Chief Complaint  Patient presents with  . Neck - Pain    DOI 06/24/16  . Middle Back - Pain  . Lower Back - Pain    HPI Sean Page is a 31 year old male comes in today with multiple complaints of neck and back and low back pain. She reports that on 06/24/2016 he was at work working on a pipe and then over with his arms stretched and felt pensions back jerked stretched and then over [place] get a pipe but begin other felt more pinching and he is now having upper neck and  shoulder pain right hand he has numbness in his index finger and partial numbness in the thumb and middle finger. He also has right elbow pain which is achy dates it feels like somebody has a thumb in his armpit. His pain overall is getting worse. At mid back pain. Also having low back pain with numbness tingling down into his legs. He has difficulty walking secondary to swelling in his legs. Her back or neck pain. No numbness tingling or radicular symptoms in the past prior to the 06/24/2016 and stent. He has difficulties falling asleep secondary to the pain in his back and neck. Pain does awaken him. Had no bowel bladder dysfunction. He is [place] in the city local provider who daily. Cyclobenzaprine. He's been at light duties at work with no lifting greater than 10 pounds. He's also tried ibuprofen which helps some. He tried tramadol with no relief.    Review of Systems Negative for chest pain, fevers, chills, orthopnea, nausea, vomiting, bowel or bladder dysfunction, acute headache. Does note some shortness of breath when walking that is new.  Objective: Vital Signs: Ht  (1.753 m)  Wt 201 lb (91.2 kg)   BMI 29.68 kg/m   Physical Exam  Constitutional: He is oriented to person, place, and time. He appears well-developed and well-nourished. No distress.  Cardiovascular: Intact distal pulses.   Pulmonary/Chest: Effort normal.  Neurological: He is alert and oriented to person, place, and time.  Skin: He is not diaphoretic.  Psychiatric: He has a normal mood and affect. His behavior is normal.    Ortho Exam Cervical spine he has good flexion and extension. Limited rotation of the right left. Positive Spurling's bilaterally. Tenderness along the medial border of the right scapula no tenderness left scapula. 5 out of 5 strength upper extremities against resistance throughout. The tendon reflexes are 1+ at the biceps triceps and brachial radialis and equal and symmetric. Sensation grossly intact  bilateral hands. Radial pulses are 2+ bilaterally. Right elbow tenderness over the lateral epicondyle. No increased pain with extension of the wrist against resistance. Thoracic spine he has tenderness over the upper and lower thoracic spinal with palpation. Lumbar spine he has limited forward flexion and extension. Negative straight leg raise bilaterally. Tight hamstrings bilaterally. 5 out of 5 strength throughout the lower extremities against resistance. Deep tendon reflexes flexes are 2+ at the knees and ankles and equal and symmetric. Sensation grossly intact bilateral feet to light touch. Dorsal pedal pulses are 2+ bilaterally .Good range of motion of both hips without pain. Specialty Comments:  No specialty comments available.  Imaging: Xr Thoracic Spine 2 View  Result Date: 07/12/2016 AP and lateral views lumbar spine: No acute fractures. Disc space well-maintained. No spondylolisthesis  Xr Cervical Spine 2 Or 3 Views  Result Date: 07/12/2016 2 views C-spine: Disc space well-maintained. Normal lordotic curvature maintained. No acute fractures.  Xr Lumbar Spine 2-3 Views  Result Date: 07/12/2016 AP and lateral views lumbar spine: No acute fractures. No bony abnormalities.Disc space well-maintained. No spondylolisthesis.    PMFS History: There are no active problems to display for this patient.  Past Medical History:  Diagnosis Date  . Glaucoma   . Migraine     History reviewed. No pertinent family history.  History reviewed. No pertinent surgical history. Social History   Occupational History  . Not on file.   Social History Main Topics  . Smoking status: Current Every Day Smoker  . Smokeless tobacco: Never Used  . Alcohol use No  . Drug use: No  . Sexual activity: Not on file

## 2016-07-14 ENCOUNTER — Ambulatory Visit: Payer: 59 | Admitting: Physical Therapy

## 2016-07-23 ENCOUNTER — Telehealth (INDEPENDENT_AMBULATORY_CARE_PROVIDER_SITE_OTHER): Payer: Self-pay | Admitting: Physician Assistant

## 2016-07-23 DIAGNOSIS — G43009 Migraine without aura, not intractable, without status migrainosus: Secondary | ICD-10-CM | POA: Diagnosis not present

## 2016-07-23 NOTE — Telephone Encounter (Signed)
ERROR

## 2016-07-26 ENCOUNTER — Encounter (INDEPENDENT_AMBULATORY_CARE_PROVIDER_SITE_OTHER): Payer: Self-pay | Admitting: Physician Assistant

## 2016-07-26 ENCOUNTER — Telehealth (INDEPENDENT_AMBULATORY_CARE_PROVIDER_SITE_OTHER): Payer: Self-pay | Admitting: *Deleted

## 2016-07-26 ENCOUNTER — Ambulatory Visit (INDEPENDENT_AMBULATORY_CARE_PROVIDER_SITE_OTHER): Payer: Worker's Compensation | Admitting: Physician Assistant

## 2016-07-26 VITALS — Ht 69.0 in | Wt 201.0 lb

## 2016-07-26 DIAGNOSIS — M542 Cervicalgia: Secondary | ICD-10-CM | POA: Diagnosis not present

## 2016-07-26 DIAGNOSIS — M545 Low back pain: Secondary | ICD-10-CM | POA: Diagnosis not present

## 2016-07-26 MED ORDER — IBUPROFEN 800 MG PO TABS: 800.0000 mg | ORAL_TABLET | Freq: Three times a day (TID) | ORAL | 1 refills | Status: DC | PRN

## 2016-07-26 NOTE — Progress Notes (Signed)
Office Visit Note   Patient: Sean Page           Date of Birth: September 08, 1985           MRN: [id number] Visit Date: 07/26/2016              Requested by: No referring provider defined for this encounter. PCP: No PCP Per Patient   Assessment & Plan: Visit Diagnoses:  1. Neck pain   2. Low back pain, unspecified back pain laterality, unspecified chronicity, with sciatica presence unspecified     Plan: We'll obtain an MRI of both his lumbar spine and cervical spine to rule out HNP. Patient has tried NSAIDs, Medrol Dosepak, and physical therapy and despite this he has made minimal gains in regards to his neck and low back pain with radicular symptoms.  Follow-Up Instructions: Return in about 2 weeks (around 08/09/2016) for After MRI.   Orders:  Orders Placed This Encounter  Procedures  . MR Cervical Spine w/o contrast  . MR Lumbar Spine w/o contrast   Meds ordered this encounter  Medications  . ibuprofen (ADVIL,MOTRIN) 800 MG tablet    Sig: Take 1 tablet (800 mg total) by mouth every 8 (eight) hours as needed.    Dispense:  90 tablet    Refill:  1      Procedures: No procedures performed   Clinical Data: No additional findings.   Subjective: Chief Complaint  Patient presents with  . Neck - Follow-up    HPI Sean Page returns today follow-up of his low back and neck pain which he is had since the injury on the job on 06/24/2016. He states that his neck pain is maybe at best 20% better his back pain is also at best 20% better since the injury. He did not find the Medrol Dosepak to be helpful. The Norco was not helpful. He is requesting a refill on his ibuprofen as he finds this most beneficial. Having no bowel or bladder dysfunction. He continues to have bilateral leg numbness and tingling down into the feet. He has right upper arm numbness that goes into his index finger and sometimes affects his middle finger and thumb. Does note if he turns his head straight he  has coldness down into his toes and goes away with movement of his neck.  Review of Systems Negative for bowel or bladder dysfunction. Positive for radicular stent symptoms both legs, low back pain, numbness down the right arm into the index finger affecting the middle and finger and thumb at times. Neck pain.  Objective: Vital Signs: Ht  (1.753 m)   Wt 201 lb (91.2 kg)   BMI 29.68 kg/m   Physical Exam  Constitutional: He is oriented to person, place, and time. He appears well-developed and well-nourished.  Pulmonary/Chest: Effort normal.  Neurological: He is alert and oriented to person, place, and time.  Psychiatric: He has a normal mood and affect. His behavior is normal.    Ortho Exam  Cervical spine he has good flexion and extension. Posteromedial lengths. Tenderness medial border of the right scapula. Bilateral feet subjective decreased sensation sensation to light touch. Dorsalis pedal pulses are present bilaterally. He has slight weakness with dorsiflexion of the left foot against resistance otherwise 5 out of 5 strength throughout the lower trimmings. Negative straight leg raise bilaterally  Specialty Comments:  No specialty comments available.  Imaging: No results found.   PMFS History: There are no active problems to display for this  patient.  Past Medical History:  Diagnosis Date  . Glaucoma   . Migraine     No family history on file.  No past surgical history on file. Social History   Occupational History  . Not on file.   Social History Main Topics  . Smoking status: Current Every Day Smoker  . Smokeless tobacco: Never Used  . Alcohol use No  . Drug use: No  . Sexual activity: Not on file

## 2016-08-04 ENCOUNTER — Telehealth (INDEPENDENT_AMBULATORY_CARE_PROVIDER_SITE_OTHER): Payer: Self-pay | Admitting: Radiology

## 2016-08-04 DIAGNOSIS — M4802 Spinal stenosis, cervical region: Secondary | ICD-10-CM

## 2016-08-04 NOTE — Telephone Encounter (Signed)
Urgent [facility]. Patient is workers Occupational hygienistcomp. Has c6-7 mass effect on cord with contusion and edema. Bronson CurbGil would like him to be seen in the next couple days if possible.

## 2016-08-12 ENCOUNTER — Telehealth (INDEPENDENT_AMBULATORY_CARE_PROVIDER_SITE_OTHER): Payer: Self-pay | Admitting: Physician Assistant

## 2016-08-12 NOTE — Telephone Encounter (Signed)
See referral messages re status

## 2016-08-12 NOTE — Telephone Encounter (Signed)
Called [facility]. Per CSR [name] is reviewing the info and will call me back re auth. See referral messages. I called pt and adv we are still waiting on approval and will call him once we get it.

## 2016-08-12 NOTE — Telephone Encounter (Signed)
Patient checking the status of an urgent referral to [facility], he states he has not heard from our office, [facility], nor [facility]. Can you check on the status with [facility].

## 2016-08-12 NOTE — Telephone Encounter (Signed)
Received call from Lucinda wanting to know if there is a particular doctor @ [facility] and I advised no. She will work on getting set up.

## 2016-08-16 ENCOUNTER — Ambulatory Visit (INDEPENDENT_AMBULATORY_CARE_PROVIDER_SITE_OTHER): Payer: Self-pay | Admitting: Physician Assistant

## 2016-08-26 NOTE — Telephone Encounter (Signed)
Spoke with pt. He saw Dr. Marikay Alaravid Jones on 08/17/16 and their office is trying to get surgery auth for him.

## 2016-09-17 DIAGNOSIS — G43009 Migraine without aura, not intractable, without status migrainosus: Secondary | ICD-10-CM | POA: Diagnosis not present

## 2016-09-28 DIAGNOSIS — Z01812 Encounter for preprocedural laboratory examination: Secondary | ICD-10-CM | POA: Diagnosis not present

## 2016-09-28 DIAGNOSIS — M4802 Spinal stenosis, cervical region: Secondary | ICD-10-CM | POA: Diagnosis not present

## 2016-09-28 DIAGNOSIS — G43009 Migraine without aura, not intractable, without status migrainosus: Secondary | ICD-10-CM | POA: Diagnosis not present

## 2016-10-29 DIAGNOSIS — G43009 Migraine without aura, not intractable, without status migrainosus: Secondary | ICD-10-CM | POA: Diagnosis not present

## 2016-11-24 DIAGNOSIS — Q150 Congenital glaucoma: Secondary | ICD-10-CM | POA: Diagnosis not present

## 2016-11-24 DIAGNOSIS — H2703 Aphakia, bilateral: Secondary | ICD-10-CM | POA: Diagnosis not present

## 2016-11-24 DIAGNOSIS — H1820 Unspecified corneal edema: Secondary | ICD-10-CM | POA: Diagnosis not present

## 2017-01-06 ENCOUNTER — Telehealth: Payer: Self-pay | Admitting: Physical Medicine & Rehabilitation

## 2017-01-06 NOTE — Telephone Encounter (Signed)
Case manager Edrick Oh stated Dr. Yetta Barre with [facility] spoke with you about treating a workers comp patient, Sean Page. Please advise are you aware of the patient's referral.

## 2017-01-06 NOTE — Telephone Encounter (Signed)
Yes I did. Please proceed. thx

## 2017-01-06 NOTE — Telephone Encounter (Signed)
I spoke with case Production designer, theatre/television/film. She is going to compile the referral with the help of Dr. Yetta Barre office and will submit referral to our office as soon as possible. The main concern of this referral is spasticity. The case manager was asking about how long the referral process would take.  She was wondering if it would be possible to get the patient scheduled while referral was in process.  I expressed that I did not have knowledge in that regard and that I would forward that question to our clinic manager. Please call case manager, Edrick Oh, back at [phone].

## 2017-01-12 ENCOUNTER — Encounter: Payer: Self-pay | Admitting: Physical Medicine & Rehabilitation

## 2017-01-20 DIAGNOSIS — Q150 Congenital glaucoma: Secondary | ICD-10-CM | POA: Diagnosis not present

## 2017-02-15 ENCOUNTER — Encounter (INDEPENDENT_AMBULATORY_CARE_PROVIDER_SITE_OTHER): Payer: Self-pay

## 2017-02-15 ENCOUNTER — Encounter: Payer: Self-pay | Admitting: Physical Medicine & Rehabilitation

## 2017-02-15 ENCOUNTER — Other Ambulatory Visit: Payer: Self-pay

## 2017-02-15 ENCOUNTER — Encounter
Payer: Worker's Compensation | Attending: Physical Medicine & Rehabilitation | Admitting: Physical Medicine & Rehabilitation

## 2017-02-15 DIAGNOSIS — S14106S Unspecified injury at C6 level of cervical spinal cord, sequela: Secondary | ICD-10-CM

## 2017-02-15 DIAGNOSIS — G253 Myoclonus: Secondary | ICD-10-CM | POA: Diagnosis not present

## 2017-02-15 DIAGNOSIS — Z5189 Encounter for other specified aftercare: Secondary | ICD-10-CM | POA: Insufficient documentation

## 2017-02-15 DIAGNOSIS — M50223 Other cervical disc displacement at C6-C7 level: Secondary | ICD-10-CM | POA: Diagnosis not present

## 2017-02-15 DIAGNOSIS — F17210 Nicotine dependence, cigarettes, uncomplicated: Secondary | ICD-10-CM | POA: Diagnosis not present

## 2017-02-15 DIAGNOSIS — H409 Unspecified glaucoma: Secondary | ICD-10-CM | POA: Insufficient documentation

## 2017-02-15 DIAGNOSIS — M6289 Other specified disorders of muscle: Secondary | ICD-10-CM | POA: Insufficient documentation

## 2017-02-15 MED ORDER — CLONAZEPAM 0.25 MG PO TBDP: 0.2500 mg | ORAL_TABLET | Freq: Two times a day (BID) | ORAL | 0 refills | Status: DC

## 2017-02-15 NOTE — Patient Instructions (Signed)
1. CONTINUE WITH YOUR BACLOFEN AT THE TWICE DAILY DOSE WHILE YOU START THE NEW MEDICATION (KLONOPIN). IF THE NEW MEDICATION IS HELPING, YOU CAN JUST STOP THE BACLOFEN COMPLETELY. IF NOT CALL ME IN A COUPLE WEEKS AND WE CAN INCREASE THE DOSE OF THE KLONOPIN   2. USE COMMON SENSE AT WORK.    PLEASE FEEL FREE TO CALL OUR OFFICE WITH ANY PROBLEMS OR QUESTIONS [phone])

## 2017-02-15 NOTE — Progress Notes (Signed)
Subjective:    Patient ID: Sean Page, male    DOB: 05/09/1985, 31 y.o.   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  He continued to try to finish the job but noticed increasing pain in his right arm and hand the next day. He eventally developed   tingling and numbness in both of his legs as well as swelling in his legs/stomach.  MRI of the cervical cord revealed a C6-7 disc herniation compressing the cord with signal changes consistent with myelopathy.  He was seen by Dr. Marikay Alar who performed an ACDF with plating at C6 through 7 which was performed on July 11th 2018. He has been involved with physical therapy who is addressing spasticity and strength. He is seeing progress with therapy. He walks better at slow speeds but struggles at faster speeds. He is improving with balance gradually on uneven surfaces. He continues to notice sensory loss in his legs as well as pain in his hips and knees.  He has resumed full-time work as of September with some modifications made by surgery which were light duty essentially.  Now some other modifications have been made to allow increased duty.  He takes rest breaks when he can and denies any problems tolerating the demands of his job at present.  He is able to empty his bowels and bladder although he does feel more "full" than he did prior to this injury.  He does admit to gaining some weight initially due to inactivity.  He also likes to eat fried foods..    Regarding his spasms, they are most frequent in his thighs and knees. They are bouncing/shaking jerking movements which are most severe when he walks. He also has had his left knee buckle at times.  Legs give away and the shaking happens also most often when he is tired and fatigued.  He is using  baclofen for spasms but this is caused swelling in his legs.  As a result he is using 5mg  TID Monday thru Thursday and BID Friday thru Sunday. It is effective despite the swelling. He is using minimal ibuprofen 800mg  (1-2x per month).  He's off atenolol (150mg )  and zonegran (100mg  bid) which he's used for migraines in the past. He'll use a flexeril on occasion for generalized soreness or headaches. He's not using any narcotics currently.    Pain Inventory Average Pain 3 Pain Right Now 3 My pain is dull, tingling and aching  In the last 24 hours, has pain interfered with the following? General activity 3 Relation with others 3 Enjoyment of life 3 What TIME of day is your pain at its worst? evening Sleep (in general) Poor  Pain is worse with: walking, inactivity and some activites Pain improves with: rest, therapy/exercise and medication Relief from Meds: 0  Mobility ability to climb steps?  yes do you drive?  yes  Function employed # of hrs/week 40  Neuro/Psych weakness numbness trouble walking  Prior Studies Any changes since last visit?  no  Physicians involved in your care Any changes since last visit?  no   No family history on file. Social History   Socioeconomic History  . Marital status: Single    Spouse name: Not on file  .  Number of children: Not on file  . Years of education: Not on file  . Highest education level: Not on file  Social Needs  . Financial resource strain: Not on file  . Food insecurity - worry: Not on file  . Food insecurity - inability: Not on file  . Transportation needs - medical: Not on file  . Transportation needs - non-medical: Not on file  Occupational History  . Not on file  Tobacco Use  . Smoking status: Current Every Day Smoker  . Smokeless tobacco: Never Used  Substance and Sexual Activity  . Alcohol use: No  . Drug use: No  . Sexual activity: Not on file  Other Topics Concern  . Not on file  Social History Narrative    . Not on file   No past surgical history on file. Past Medical History:  Diagnosis Date  . Glaucoma   . Migraine    There were no vitals taken for this visit.  Opioid Risk Score:   Fall Risk Score:  `1  Depression screen PHQ 2/9  Depression screen PHQ 2/9 02/15/2017  Decreased Interest 1  Down, Depressed, Hopeless 1  PHQ - 2 Score 2  Altered sleeping 2  Tired, decreased energy 2  Change in appetite 1  Feeling bad or failure about yourself  2  Trouble concentrating 1  Moving slowly or fidgety/restless 3  Suicidal thoughts 0  PHQ-9 Score 13  Difficult doing work/chores Somewhat difficult     Review of Systems  Constitutional: Positive for unexpected weight change.  HENT: Negative.   Eyes: Negative.   Respiratory: Positive for shortness of breath.   Cardiovascular: Negative.   Gastrointestinal: Negative.   Endocrine: Negative.   Genitourinary: Negative.   Musculoskeletal: Negative.   Skin: Negative.   Allergic/Immunologic: Negative.   Neurological: Negative.   Hematological: Negative.   Psychiatric/Behavioral: Negative.        Objective:   Physical Exam   General: Alert and oriented x 3, No apparent distress.  Wearing thick glasses HEENT: Head is normocephalic, atraumatic, PERRLA, EOMI, sclera anicteric, oral mucosa pink and moist, dentition intact, ext ear canals clear,  Neck: Supple without JVD or lymphadenopathy Heart: Reg rate and rhythm. No murmurs rubs or gallops Chest: CTA bilaterally without wheezes, rales, or rhonchi; no distress Abdomen: Soft, non-tender, non-distended, bowel sounds positive. Extremities: No clubbing, cyanosis, or edema. Pulses are 2+ Skin: Clean and intact without signs of breakdown Neuro: Patient is alert and oriented x3.  Cranial nerve exam is grossly intact.  Displays normal insight and awareness.  Concentration is functional as is memory.  Upper extremity strength is 5 out of 5 bilaterally proximal to distal.  Lower extremity  strength is 4+ to 5 out of 5 proximal and distal as well.  He does have decreased sensation to light touch  at the C6 level and below.  He is able to sense pain versus normal/gross touch.  We performed some proprioceptive exercises on his legs and he was able to discern shapes grossly.  He appeared to be a bit bit more sensitive on the right leg than the left.  Gait was notable for a wide-based pattern.  He tended to wander to the right slightly when he walked.  Deep tendon reflexes are 1+ in the upper extremities there are 3+ at the patella and Achilles tendons Musculoskeletal: Full ROM, No pain with AROM or PROM in the neck, trunk, or extremities. Posture appropriate Psych: Pt's affect is appropriate. Pt is cooperative  Assessment & Plan:  1. 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  I suspect a lot of his spastic symptoms are a result of muscle fatigue when he is working or physically exerting for longer periods of time.    Plan: 1. Trial of klonopin 0.25mg  BID, titrate further as tolerated 2. Can continue with baclofen, decreasing to his weekend schedule if the klonopin is heliping 3. Continue with PT and HEP to improve his lower extremity strength, balance and coordination.  I explained to him that it will just take time for him to regain some of the stamina he is looking for.  In the meantime he also needs to make sure he is using common sense at work.  I wrote a prescription for therapy to continue for another 4 weeks at least. 5.  May use ibuprofen on a limited basis for breakthrough pain  6.  The patient will follow up with me in about 1 month's time.  30 minutes were spent in direct patient contact during this examination.  I also reviewed the case with his case manager who was present today.  I would like to thank Dr. Yetta BarreJones for referring this pleasant gentleman to our office

## 2017-03-16 ENCOUNTER — Encounter
Payer: Worker's Compensation | Attending: Physical Medicine & Rehabilitation | Admitting: Physical Medicine & Rehabilitation

## 2017-03-16 ENCOUNTER — Encounter: Payer: Self-pay | Admitting: Physical Medicine & Rehabilitation

## 2017-03-16 VITALS — BP 166/115 | HR 104

## 2017-03-16 DIAGNOSIS — S14106S Unspecified injury at C6 level of cervical spinal cord, sequela: Secondary | ICD-10-CM | POA: Diagnosis not present

## 2017-03-16 DIAGNOSIS — S24102A Unspecified injury at T2-T6 level of thoracic spinal cord, initial encounter: Secondary | ICD-10-CM | POA: Diagnosis not present

## 2017-03-16 DIAGNOSIS — X58XXXA Exposure to other specified factors, initial encounter: Secondary | ICD-10-CM | POA: Diagnosis not present

## 2017-03-16 DIAGNOSIS — F17200 Nicotine dependence, unspecified, uncomplicated: Secondary | ICD-10-CM | POA: Insufficient documentation

## 2017-03-16 DIAGNOSIS — G253 Myoclonus: Secondary | ICD-10-CM | POA: Insufficient documentation

## 2017-03-16 MED ORDER — CLONAZEPAM 0.5 MG PO TABS: 0.5000 mg | ORAL_TABLET | ORAL | 2 refills | Status: DC

## 2017-03-16 NOTE — Patient Instructions (Signed)
PLEASE FEEL FREE TO CALL OUR OFFICE WITH ANY PROBLEMS OR QUESTIONS ([phone])  HAVE A HAPPY HOLIDAYS!                     ^                  ^^                ^ ^ ^             ^ ^ ^ ^ ^           ^ ^ ^ ^ ^ ^ ^        ^ ^ ^ ^ ^ ^ ^ ^ ^      ^ ^ ^ ^ ^ ^ ^ ^ ^ ^ ^                ^^^^                ^^^^                ^^^^     

## 2017-03-16 NOTE — Progress Notes (Signed)
Subjective:    Patient ID: Sean Page, male    DOB: 07/21/1985, 31 y.o.   MRN: [id number]  HPI   Sean Page is here in follow-up of his cervical spinal cord injury.  We added Klonopin at his last visit for his mild clonus and spasms.  This has proven beneficial for him.  He stopped the baclofen and his hydrocodone.  He still works full-time.  He is taking a bit longer to complete jobs and get his work done but he is getting all of his work done in a timely manner.  He does find that he fatigues easily still in some days he fatigues quicker than others.  Incidentally he has had some low back and knee pain as well.  The Klonopin helped enough that he tried taking an additional dose around lunchtime as he felt his tightness was worsening around then.  This is proven beneficial for him without any tolerance issues while he was at work.  Pain Inventory Average Pain 3 Pain Right Now 3 My pain is constant and aching  In the last 24 hours, has pain interfered with the following? General activity 3 Relation with others 3 Enjoyment of life 3 What TIME of day is your pain at its worst? evening Sleep (in general) Poor  Pain is worse with: walking, bending, standing and some activites Pain improves with: rest, heat/ice, medication and TENS Relief from Meds: 7  Mobility walk without assistance ability to climb steps?  yes do you drive?  yes  Function employed # of hrs/week 40  Neuro/Psych numbness tremor tingling trouble walking spasms  Prior Studies Any changes since last visit?  no  Physicians involved in your care Any changes since last visit?  no   No family history on file. Social History   Socioeconomic History  . Marital status: Single    Spouse name: Not on file  . Number of children: Not on file  . Years of education: Not on file  . Highest education level: Not on file  Social Needs  . Financial resource strain: Not on file  . Food insecurity - worry:  Not on file  . Food insecurity - inability: Not on file  . Transportation needs - medical: Not on file  . Transportation needs - non-medical: Not on file  Occupational History  . Not on file  Tobacco Use  . Smoking status: Current Every Day Smoker  . Smokeless tobacco: Never Used  Substance and Sexual Activity  . Alcohol use: No  . Drug use: No  . Sexual activity: Not on file  Other Topics Concern  . Not on file  Social History Narrative  . Not on file   No past surgical history on file. Past Medical History:  Diagnosis Date  . Glaucoma   . Migraine    There were no vitals taken for this visit.  Opioid Risk Score:   Fall Risk Score:  `1  Depression screen PHQ 2/9  Depression screen PHQ 2/9 02/15/2017  Decreased Interest 1  Down, Depressed, Hopeless 1  PHQ - 2 Score 2  Altered sleeping 2  Tired, decreased energy 2  Change in appetite 1  Feeling bad or failure about yourself  2  Trouble concentrating 1  Moving slowly or fidgety/restless 3  Suicidal thoughts 0  PHQ-9 Score 13  Difficult doing work/chores Somewhat difficult     Review of Systems  Constitutional: Negative.   HENT: Negative.   Eyes: Negative.   Respiratory:  Positive for cough, shortness of breath and wheezing.   Cardiovascular: Negative.   Gastrointestinal: Negative.   Endocrine: Negative.   Genitourinary: Negative.   Musculoskeletal: Negative.   Skin: Negative.   Allergic/Immunologic: Negative.   Neurological: Negative.   Hematological: Negative.   Psychiatric/Behavioral: Negative.   All other systems reviewed and are negative.      Objective:   Physical Exam  General: Alert and oriented x 3, No apparent distress.  Wearing thick glasses HEENT: Head is normocephalic, atraumatic, PERRLA, EOMI, sclera anicteric, oral mucosa pink and moist, dentition intact, ext ear canals clear,  Neck: Supple without JVD or lymphadenopathy Heart:  Regular rate Chest:  Normal effort Abdomen: Soft,  non-tender, non-distended, bowel sounds positive. Extremities: No clubbing, cyanosis, or edema. Pulses are 2+ Skin: Clean and intact without signs of breakdown Neuro: Patient is alert and oriented x3.  Cranial nerve exam is grossly intact.  Displays normal insight and awareness.  Concentration is functional as is memory.  Upper extremity strength is 5 out of 5 bilaterally proximal to distal.  Lower extremity strength is grossly 5 out of 5 throughout.  Deep tendon reflexes are 3+ in the lower extremities.  He has slight decrease in proprioception in the limbs but has functional balance and movement.  Gait slightly wide-based but generally stable.  Musculoskeletal: Full ROM, No pain with AROM or PROM in the neck, trunk, or extremities. Posture appropriate Psych: Pt's affect is appropriate. Pt is cooperative        Assessment & Plan:  1. 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  I suspect a lot of his spastic symptoms are a result of muscle fatigue when he is working or physically exerting for longer periods of time. 2. Lower extremity   Plan: 1. Increase klonopin to 0.25-0.5mg  TID.  He will use a 0.5 mg dose in the morning then 0.25 to 0.5 mg in the afternoon and then 0.5 mg after dinner.  He can increase to 0.5 mg 3 times daily if needed and tolerated.    2.  He will remain off of baclofen and hydrocodone. 3.  He will continue with outpatient PT but transition to weekly PT visits and then HEP eventually.  We discussed the fact that needs to take time and use common sense in general. 5.    He has questions about prognosis.  Overall he is already doing quite well.  Given that he is an ASIA D injury he should continue to display further progress over the next several months to year.  He may have some residual deficits but he should continue to be a functional ambulator and be vocational. 6.  Follow up with me in about 2 months time.   15 minutes were spent in  direct patient contact during this examination.  I again also reviewed the case with his case manager who was present today.

## 2017-03-30 DIAGNOSIS — Q150 Congenital glaucoma: Secondary | ICD-10-CM | POA: Diagnosis not present

## 2017-03-30 DIAGNOSIS — H2703 Aphakia, bilateral: Secondary | ICD-10-CM | POA: Diagnosis not present

## 2017-03-30 DIAGNOSIS — H1820 Unspecified corneal edema: Secondary | ICD-10-CM | POA: Diagnosis not present

## 2017-05-17 ENCOUNTER — Encounter
Payer: Worker's Compensation | Attending: Physical Medicine & Rehabilitation | Admitting: Physical Medicine & Rehabilitation

## 2017-05-17 ENCOUNTER — Encounter: Payer: Self-pay | Admitting: Physical Medicine & Rehabilitation

## 2017-05-17 VITALS — BP 154/94 | HR 116

## 2017-05-17 DIAGNOSIS — S24102D Unspecified injury at T2-T6 level of thoracic spinal cord, subsequent encounter: Secondary | ICD-10-CM | POA: Diagnosis not present

## 2017-05-17 DIAGNOSIS — R600 Localized edema: Secondary | ICD-10-CM | POA: Insufficient documentation

## 2017-05-17 DIAGNOSIS — G253 Myoclonus: Secondary | ICD-10-CM

## 2017-05-17 DIAGNOSIS — F17200 Nicotine dependence, unspecified, uncomplicated: Secondary | ICD-10-CM | POA: Insufficient documentation

## 2017-05-17 DIAGNOSIS — M7989 Other specified soft tissue disorders: Secondary | ICD-10-CM | POA: Insufficient documentation

## 2017-05-17 DIAGNOSIS — H409 Unspecified glaucoma: Secondary | ICD-10-CM | POA: Diagnosis not present

## 2017-05-17 DIAGNOSIS — S14106S Unspecified injury at C6 level of cervical spinal cord, sequela: Secondary | ICD-10-CM

## 2017-05-17 DIAGNOSIS — X58XXXD Exposure to other specified factors, subsequent encounter: Secondary | ICD-10-CM | POA: Diagnosis not present

## 2017-05-17 DIAGNOSIS — S24102A Unspecified injury at T2-T6 level of thoracic spinal cord, initial encounter: Secondary | ICD-10-CM | POA: Diagnosis present

## 2017-05-17 NOTE — Patient Instructions (Signed)
PLEASE FEEL FREE TO CALL OUR OFFICE WITH ANY PROBLEMS OR QUESTIONS [phone])     RECOMMEND PT WORKING ON LADDER/CLIMBING SIMULATION AS POSSIBLE. CALL ME IF ADDITIONAL VISITS REQUIRED. IF PT IS SATISFIED WE CAN ADVANCE YOU TO LADDERS AND CLIMBING WITH HARNESS/SAFETY PRECAUTIONS IN PLACE   BE CAREFUL NOT TO BE EXCESSIVELY TORQUING YOUR NECK AND BACK WHILE YOU WORK AS WELL.

## 2017-05-17 NOTE — Progress Notes (Signed)
Subjective:    Patient ID: Sean Page, male    DOB: [birth year], 32 y.o.   MRN: [id number]  HPI   Sean Page is here in follow-up of his cervical spinal cord injury.  He is continue to participate in therapy and making further progress.  He feels that the Klonopin was helpful for his spasms initially but he noticed that he is improving with his strength and stamina and as that has been happening the Klonopin has not been as effective.  End up stopping the medicine recently has not noticed much of a difference.  He is ready to get back to work at full duty.  Apparently the only restrictions he has on his work duty is climbing/ladder restrictions.  He tells me he has 2 more visits left with PT.  He does report some periodic lower extremity swelling still, but even this has improved over time and with decrease in cessation of medications.  Pain Inventory Average Pain 2 Pain Right Now 2 My pain is none  In the last 24 hours, has pain interfered with the following? General activity 2 Relation with others 2 Enjoyment of life 3 What TIME of day is your pain at its worst? evening and night  Sleep (in general) NA  Pain is worse with: walking, bending and unsure Pain improves with: TENS Relief from Meds: 0  Mobility Do you have any goals in this area?  no  Function Do you have any goals in this area?  no  Neuro/Psych No problems in this area  Prior Studies Any changes since last visit?  no  Physicians involved in your care Any changes since last visit?  no Neurosurgeon Sean Page    No family history on file. Social History   Socioeconomic History  . Marital status: Single    Spouse name: Not on file  . Number of children: Not on file  . Years of education: Not on file  . Highest education level: Not on file  Social Needs  . Financial resource strain: Not on file  . Food insecurity - worry: Not on file  . Food insecurity - inability: Not on file  . Transportation  needs - medical: Not on file  . Transportation needs - non-medical: Not on file  Occupational History  . Not on file  Tobacco Use  . Smoking status: Current Every Day Smoker  . Smokeless tobacco: Never Used  Substance and Sexual Activity  . Alcohol use: No  . Drug use: No  . Sexual activity: Not on file  Other Topics Concern  . Not on file  Social History Narrative  . Not on file   No past surgical history on file. Past Medical History:  Diagnosis Date  . Glaucoma   . Migraine    There were no vitals taken for this visit.  Opioid Risk Score:   Fall Risk Score:  `1  Depression screen PHQ 2/9  Depression screen PHQ 2/9 02/15/2017  Decreased Interest 1  Down, Depressed, Hopeless 1  PHQ - 2 Score 2  Altered sleeping 2  Tired, decreased energy 2  Change in appetite 1  Feeling bad or failure about yourself  2  Trouble concentrating 1  Moving slowly or fidgety/restless 3  Suicidal thoughts 0  PHQ-9 Score 13  Difficult doing work/chores Somewhat difficult     Review of Systems  All other systems reviewed and are negative.      Objective:   Physical Exam  General: Alert and  oriented x 3, No apparent distress.Wearing thick glasses HEENT:Head is normocephalic, atraumatic, PERRLA, EOMI, sclera anicteric, oral mucosa pink and moist, dentition intact, ext ear canals clear,  Neck:Supple without JVD or lymphadenopathy Heart: rrr Chest:normal effort Abdomen:Soft, non-tender, non-distended, bowel sounds positive. Extremities:No clubbing, cyanosis, or edema. Pulses are 2+ Skin:Clean and intact without signs of breakdown Neuro:Patient is alert and oriented x3. Cranial nerve exam is grossly intact. Displays normal insight and awareness. Concentration is functional as is memory. Upper extremity strength is 5 out of 5 bilaterally proximal to distal.  Lower extremity strength is grossly 5 out of 5 throughout.  Deep tendon reflexes are 3+ in the lower extremities.     Patient still with some mild proprioceptive deficits however balance is improving.  Romberg is negative.  Is able stand on one leg with eyes closed with only minor loss of balance.  He performed tandem gait with minimal loss of balance.  Otherwise gait was completely normal.   Musculoskeletal:Full ROM, No pain with AROM or PROM in the neck, trunk, or extremities. Posture appropriate Psych:Pt's affect is appropriate. Pt is cooperative     Assessment & Plan:  1. 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 2. Lower extremity edema   Plan: 1. Can remain off klonopin.    As he is experienced enough neurological recovery that the medication is no longer needed and really is not overly effective at this point. 2. off hydrocodone.  May use ibuprofen for breakthrough pain.  Discussed the fact that he needs to be smart about lifting and torquing his back and neck at work. 3.   I think with some common sense and safety awareness that he could climb some ladders at work.  However, I would like for therapy to address ladders and climbing with him first to make sure he is safe.  He has 2 more visits left with them.  If he needs more we could potentially extend him a few visits.  He should always be using a harness when he is climbing a ladder.  Sean Page tells me that ladder climbing would be infrequent at best. 5.TEDS for edema PRN. Elevate legs as needed 6.Follow up with me PRN..  15 minutes were spent in direct patient contact during this examination. I again also reviewed the case with his case manager who was present today.

## 2017-05-27 DIAGNOSIS — J0100 Acute maxillary sinusitis, unspecified: Secondary | ICD-10-CM | POA: Diagnosis not present

## 2017-07-21 DIAGNOSIS — Q150 Congenital glaucoma: Secondary | ICD-10-CM | POA: Diagnosis not present

## 2017-10-05 DIAGNOSIS — H1820 Unspecified corneal edema: Secondary | ICD-10-CM | POA: Diagnosis not present

## 2017-10-05 DIAGNOSIS — H2703 Aphakia, bilateral: Secondary | ICD-10-CM | POA: Diagnosis not present

## 2017-10-05 DIAGNOSIS — Q150 Congenital glaucoma: Secondary | ICD-10-CM | POA: Diagnosis not present

## 2018-01-23 DIAGNOSIS — H52203 Unspecified astigmatism, bilateral: Secondary | ICD-10-CM | POA: Diagnosis not present

## 2018-01-23 DIAGNOSIS — H524 Presbyopia: Secondary | ICD-10-CM | POA: Diagnosis not present

## 2018-01-23 DIAGNOSIS — H5203 Hypermetropia, bilateral: Secondary | ICD-10-CM | POA: Diagnosis not present

## 2018-02-01 DIAGNOSIS — H1820 Unspecified corneal edema: Secondary | ICD-10-CM | POA: Diagnosis not present

## 2018-02-01 DIAGNOSIS — H2703 Aphakia, bilateral: Secondary | ICD-10-CM | POA: Diagnosis not present

## 2018-02-01 DIAGNOSIS — Q150 Congenital glaucoma: Secondary | ICD-10-CM | POA: Diagnosis not present

## 2018-02-27 IMAGING — MR MR HEAD WO/W CM
10 series · 48 of 48 positions shown · IV contrast (multihance)
Comparison: 01/22/2011

CLINICAL DATA: Chronic headaches since childhood, increased since
[age] and greater on the right side.

EXAM:
MRI HEAD WITHOUT AND WITH CONTRAST
TECHNIQUE: Multiplanar, multiecho pulse sequences of the brain and surrounding
structures were obtained without and with intravenous contrast.
CONTRAST:  20mL MULTIHANCE GADOBENATE DIMEGLUMINE 529 MG/ML IV SOLN

[Series 5: T1 · sagittal · 4.0mm · 0.75mm/px · 2 of 31 slices shown (1 of 3)]
[im 1/31]
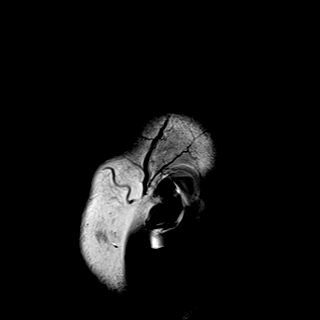
[im 31/31]
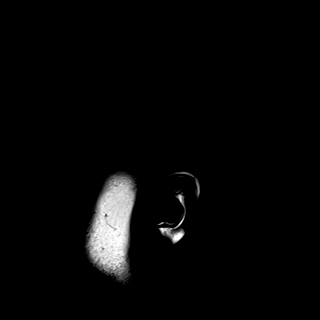

[Series 6: DWI · axial · 3.0mm · 1.44mm/px · z∈[-60,+74]mm · 6 of 84 slices shown (1 of 2)]
[im 1/84]
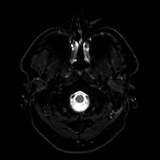
[im 17/84]
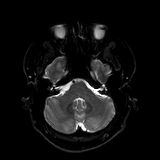
[im 34/84]
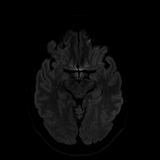
[im 50/84]
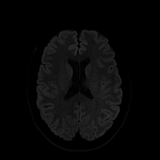
[im 67/84]
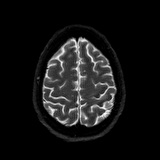
[im 84/84]
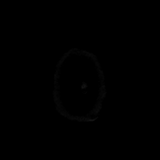

[Series 7: DWI · axial · 3.0mm · 1.44mm/px · z∈[-60,+74]mm · 3 of 38 slices shown (2 of 2)]
[im 1/38]
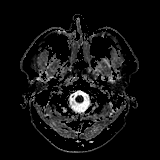
[im 19/38]
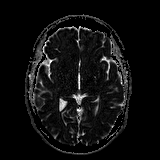
[im 38/38]
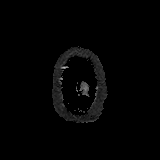

[Series 8: T2 · axial · 4.0mm · 0.36mm/px · z∈[-63,+71]mm · 2 of 27 slices shown]
[im 1/27]
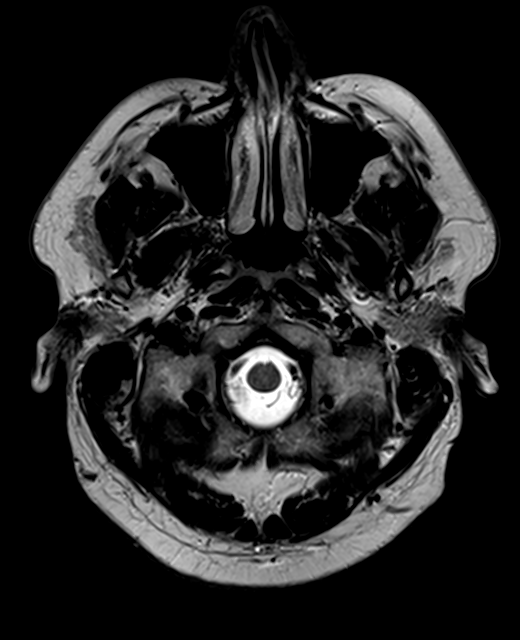
[im 27/27]
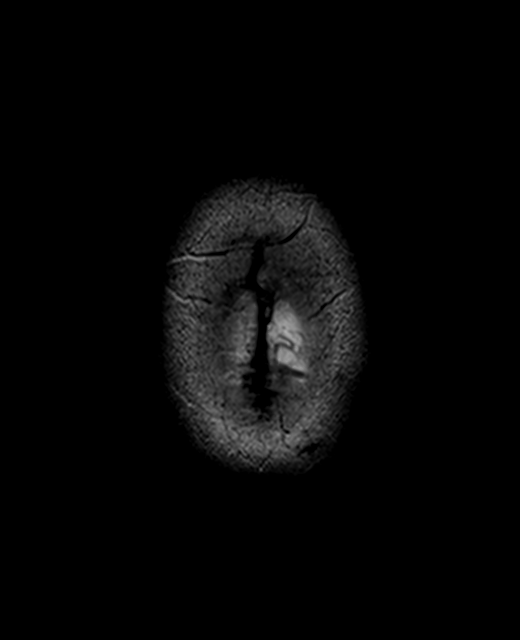

[Series 9: FLAIR · axial · 4.0mm · 0.72mm/px · z∈[-63,+71]mm · 2 of 27 slices shown]
[im 1/27]
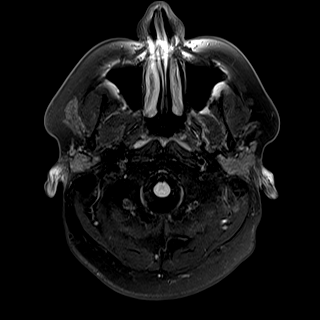
[im 27/27]
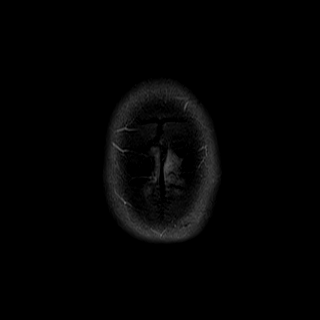

[Series 11: swi_images · axial · 1.5mm · 0.90mm/px · z∈[-67,+75]mm · 7 of 96 slices shown]
[im 1/96]
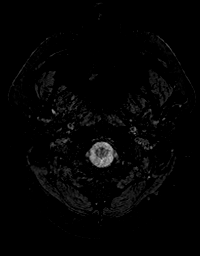
[im 16/96]
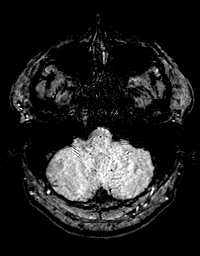
[im 32/96]
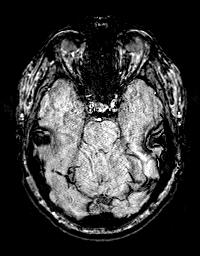
[im 48/96]
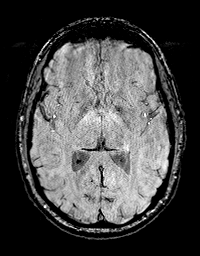
[im 64/96]
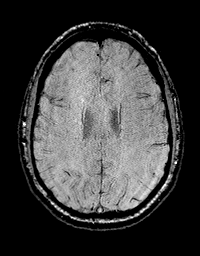
[im 80/96]
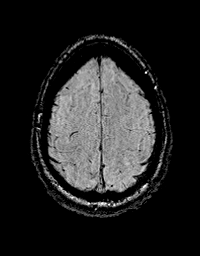
[im 96/96]
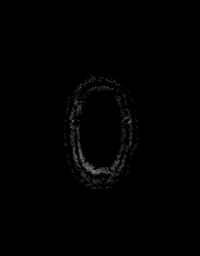

[Series 12: T1 · axial · 1.0mm · 0.90mm/px · z∈[-67,+75]mm · 11 of 144 slices shown (2 of 3)]
[im 1/144]
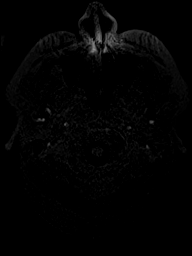
[im 15/144]
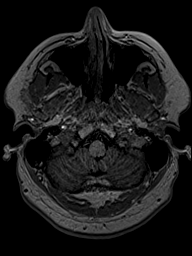
[im 29/144]
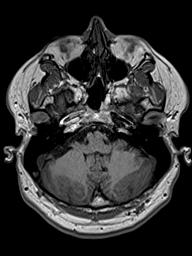
[im 43/144]
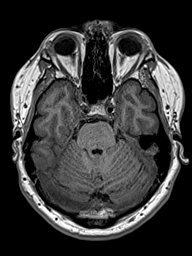
[im 58/144]
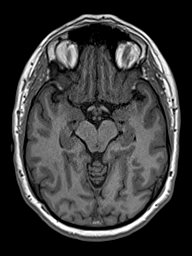
[im 72/144]
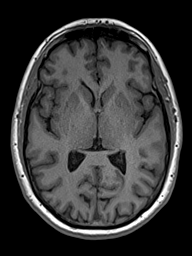
[im 86/144]
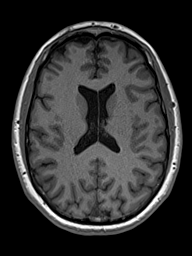
[im 101/144]
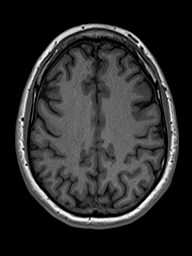
[im 115/144]
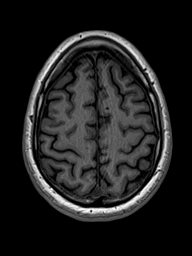
[im 129/144]
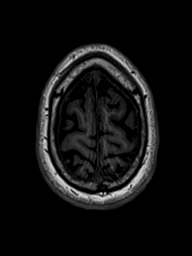
[im 144/144]
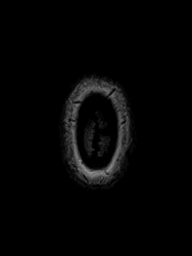

[Series 13: T2 post-contrast · coronal · 4.5mm · 0.36mm/px · 2 of 30 slices shown]
[im 1/30]
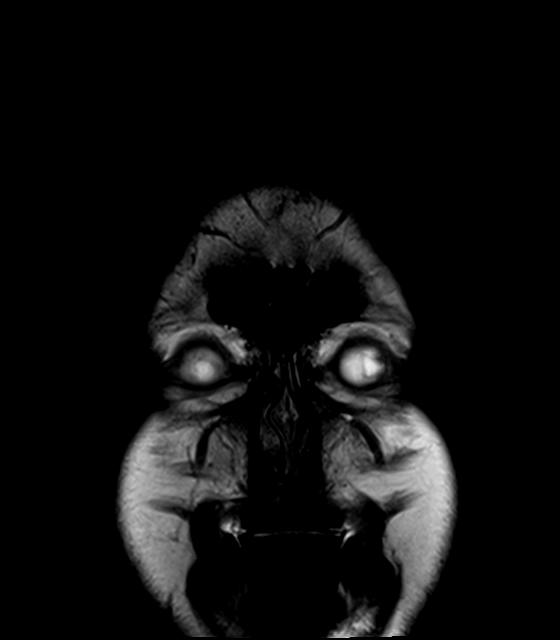
[im 30/30]
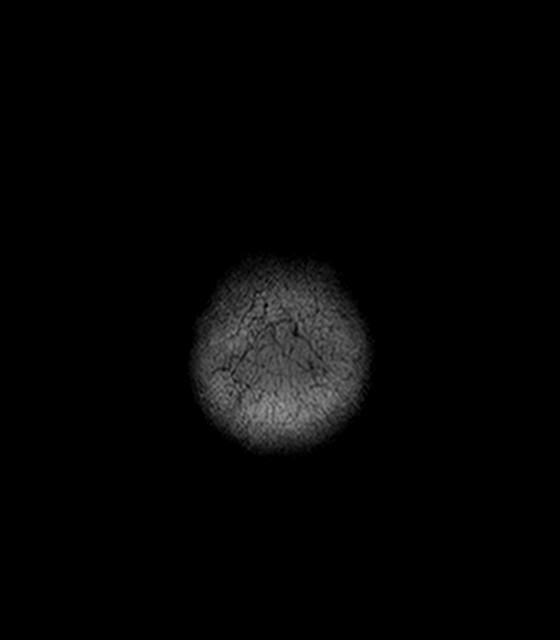

[Series 14: T1 · axial · 1.0mm · 0.90mm/px · z∈[-67,+75]mm · 11 of 144 slices shown (3 of 3)]
[im 1/144]
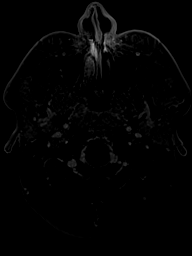
[im 15/144]
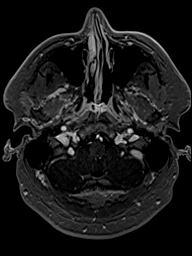
[im 29/144]
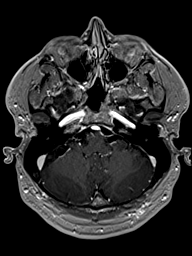
[im 43/144]
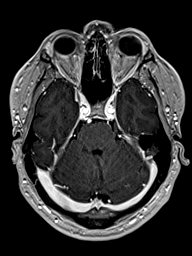
[im 58/144]
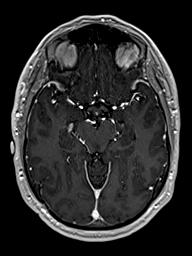
[im 72/144]
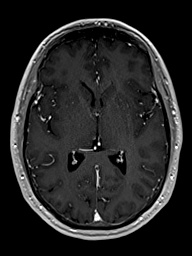
[im 86/144]
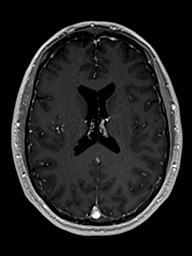
[im 101/144]
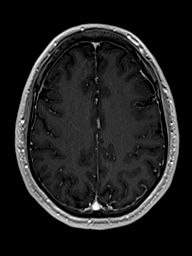
[im 115/144]
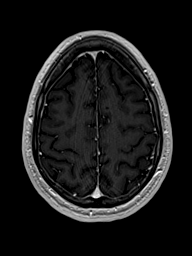
[im 129/144]
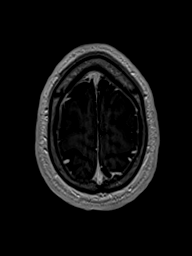
[im 144/144]
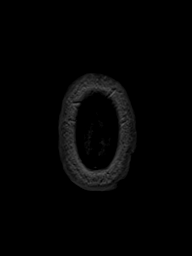

[Series 15: T1 post-contrast · coronal · 4.5mm · 0.72mm/px · 2 of 30 slices shown]
[im 1/30]
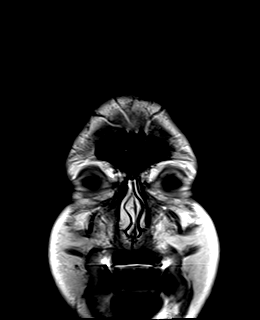
[im 30/30]
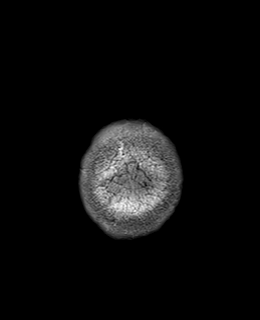

[48 of 48 positions shown; findings below may reference images not displayed]

FINDINGS: Brain: There is no evidence of acute infarct, intracranial
hemorrhage, mass, midline shift, or extra-axial fluid collection.
The ventricles and sulci are normal. The brain is normal in signal.
No abnormal enhancement is identified.

Vascular: Major intracranial vascular flow voids are preserved.

Skull and upper cervical spine: Unremarkable bone marrow signal.

Sinuses/Orbits: Unremarkable.

Other: None.
IMPRESSION: Unremarkable brain MRI.

## 2018-04-26 DIAGNOSIS — Q150 Congenital glaucoma: Secondary | ICD-10-CM | POA: Diagnosis not present

## 2018-04-26 DIAGNOSIS — H1820 Unspecified corneal edema: Secondary | ICD-10-CM | POA: Diagnosis not present

## 2018-04-26 DIAGNOSIS — H2703 Aphakia, bilateral: Secondary | ICD-10-CM | POA: Diagnosis not present

## 2018-05-29 DIAGNOSIS — Q150 Congenital glaucoma: Secondary | ICD-10-CM | POA: Diagnosis not present

## 2019-01-25 ENCOUNTER — Emergency Department (HOSPITAL_COMMUNITY)
Admission: EM | Admit: 2019-01-25 | Discharge: 2019-01-25 | Disposition: A | Discharge status: Home / Self Care | Payer: 59 | Attending: Emergency Medicine | Admitting: Emergency Medicine

## 2019-01-25 ENCOUNTER — Other Ambulatory Visit: Payer: Self-pay

## 2019-01-25 ENCOUNTER — Encounter (HOSPITAL_COMMUNITY): Payer: Self-pay | Admitting: Emergency Medicine

## 2019-01-25 DIAGNOSIS — E860 Dehydration: Secondary | ICD-10-CM

## 2019-01-25 DIAGNOSIS — F17210 Nicotine dependence, cigarettes, uncomplicated: Secondary | ICD-10-CM | POA: Insufficient documentation

## 2019-01-25 DIAGNOSIS — R799 Abnormal finding of blood chemistry, unspecified: Secondary | ICD-10-CM | POA: Diagnosis present

## 2019-01-25 DIAGNOSIS — D72829 Elevated white blood cell count, unspecified: Secondary | ICD-10-CM | POA: Diagnosis not present

## 2019-01-25 LAB — CBC WITH DIFFERENTIAL/PLATELET
Abs Immature Granulocytes: 0.11 10*3/uL — ABNORMAL HIGH (ref 0.00–0.07)
Basophils Absolute: 0.1 10*3/uL (ref 0.0–0.1)
Basophils Relative: 0 %
Eosinophils Absolute: 0.2 10*3/uL (ref 0.0–0.5)
Eosinophils Relative: 1 %
HCT: 56.4 % — ABNORMAL HIGH (ref 39.0–52.0)
Hemoglobin: 18.7 g/dL — ABNORMAL HIGH (ref 13.0–17.0)
Immature Granulocytes: 1 %
Lymphocytes Relative: 23 %
Lymphs Abs: 3.3 10*3/uL (ref 0.7–4.0)
MCH: 30.7 pg (ref 26.0–34.0)
MCHC: 33.2 g/dL (ref 30.0–36.0)
MCV: 92.6 fL (ref 80.0–100.0)
Monocytes Absolute: 1.2 10*3/uL — ABNORMAL HIGH (ref 0.1–1.0)
Monocytes Relative: 8 %
Neutro Abs: 9.3 10*3/uL — ABNORMAL HIGH (ref 1.7–7.7)
Neutrophils Relative %: 67 %
Platelets: 291 10*3/uL (ref 150–400)
RBC: 6.09 MIL/uL — ABNORMAL HIGH (ref 4.22–5.81)
RDW: 14.6 % (ref 11.5–15.5)
WBC: 14.1 10*3/uL — ABNORMAL HIGH (ref 4.0–10.5)
nRBC: 0 % (ref 0.0–0.2)

## 2019-01-25 LAB — URINALYSIS, ROUTINE W REFLEX MICROSCOPIC
Bilirubin Urine: NEGATIVE
Glucose, UA: NEGATIVE mg/dL
Hgb urine dipstick: NEGATIVE
Ketones, ur: NEGATIVE mg/dL
Leukocytes,Ua: NEGATIVE
Nitrite: NEGATIVE
Protein, ur: NEGATIVE mg/dL
Specific Gravity, Urine: 1.015 (ref 1.005–1.030)
pH: 5 (ref 5.0–8.0)

## 2019-01-25 LAB — COMPREHENSIVE METABOLIC PANEL
ALT: 38 U/L (ref 0–44)
AST: 23 U/L (ref 15–41)
Albumin: 4.3 g/dL (ref 3.5–5.0)
Alkaline Phosphatase: 109 U/L (ref 38–126)
Anion gap: 13 (ref 5–15)
BUN: 17 mg/dL (ref 6–20)
CO2: 19 mmol/L — ABNORMAL LOW (ref 22–32)
Calcium: 9.3 mg/dL (ref 8.9–10.3)
Chloride: 107 mmol/L (ref 98–111)
Creatinine, Ser: 1.05 mg/dL (ref 0.61–1.24)
GFR calc Af Amer: >60 mL/min (ref >60)
GFR calc non Af Amer: >60 mL/min (ref >60)
Glucose, Bld: 95 mg/dL (ref 70–99)
Potassium: 3.6 mmol/L (ref 3.5–5.1)
Sodium: 139 mmol/L (ref 135–145)
Total Bilirubin: 0.3 mg/dL (ref 0.3–1.2)
Total Protein: 8.3 g/dL — ABNORMAL HIGH (ref 6.5–8.1)

## 2019-01-25 LAB — LACTIC ACID, PLASMA
Lactic Acid, Venous: 1.6 mmol/L (ref 0.5–1.9)
Lactic Acid, Venous: 1.6 mmol/L (ref 0.5–1.9)

## 2019-01-25 MED ORDER — SODIUM CHLORIDE 0.9 % IV BOLUS
1000.0000 mL | Freq: Once | INTRAVENOUS | Status: AC
  Administered 2019-01-25: 1000 mL via INTRAVENOUS

## 2019-01-25 NOTE — ED Notes (Signed)
Attempted IV access twice, unsuccessful. Requested another nurse assist.

## 2019-01-25 NOTE — Discharge Instructions (Addendum)
Return here as needed. Follow up with your primary doctor. Increase your fluid intake. °

## 2019-01-25 NOTE — ED Triage Notes (Signed)
Patient reports sent from PCP for evaluation of elevated WBC. Denies fever, N/V/D, urinary sx, cough, chest pain, SOB. States eye surgery in August and October.

## 2019-01-25 NOTE — ED Notes (Signed)
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 no fevers at home just states he has been having "hot flashes" since his first eye surgery in august.

## 2019-01-26 NOTE — ED Provider Notes (Signed)
[facility]-EMERGENCY DEPT Provider Note   CSN: [id number] Arrival date & time: 01/25/19  1622     History   Chief Complaint Chief Complaint  Patient presents with  . Abnormal Lab    HPI Sean Page is a 33 y.o. male.     HPI Patient presents to the emergency department with an elevated white blood cell count from his doctor.  The patient states that he is feeling no symptoms but was told at his doctor that he had an elevated white blood cell count.  Patient states that he does not feel bad and does not have any upper respiratory symptoms.  Patient states he has no other issues.  Patient had a recent corneal transplant.  He states that he has been using all of his medications and has been closely followed by his eye doctor and has seen them in the last 1 to 2 days.  The patient denies chest pain, shortness of breath, headache,blurred vision, neck pain, fever, cough, weakness, numbness, dizziness, anorexia, edema, abdominal pain, nausea, vomiting, diarrhea, rash, back pain, dysuria, hematemesis, bloody stool, near syncope, or syncope.  Patient was sent here by his doctor because he has an elevated white blood cell count and they were worried that he may have infection. Past Medical History:  Diagnosis Date  . Glaucoma   . Migraine     Patient Active Problem List   Diagnosis Date Noted  . C6 spinal cord injury, sequela (HCC) 02/15/2017  . 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 No pertinent surgical history.      Home Medications    Prior to Admission medications   Medication Sig Start Date End Date Taking? Authorizing Provider  ibuprofen (ADVIL,MOTRIN) 800 MG tablet Take 1 tablet (800 mg total) by mouth every 8 (eight) hours as needed. 07/26/16   Kirtland Bouchard, PA-C    Family History No family history on file.  Social History Social History   Tobacco Use  . Smoking status: Current Every Day Smoker  . Smokeless tobacco: Never Used   Substance Use Topics  . Alcohol use: No  . Drug use: No     Allergies   Patient has no known allergies.   Review of Systems Review of Systems  All other systems negative except as documented in the HPI. All pertinent positives and negatives as reviewed in the HPI. Physical Exam Updated Vital Signs BP (!) 147/107   Pulse (!) 102   Temp 98.4 F (36.9 C) (Oral)   Resp (!) 24   Ht 5\' 10"  (1.778 m)   Wt 113.4 kg   SpO2 92%   BMI 35.87 kg/m   Physical Exam Vitals signs and nursing note reviewed.  Constitutional:      General: He is not in acute distress.    Appearance: He is well-developed.  HENT:     Head: Normocephalic and atraumatic.  Eyes:     Pupils: Pupils are equal, round, and reactive to light.     Comments: Patient swelling around the left eye which he states has been present since his surgery.  The patient states that this is been normal and not change.  Neck:     Musculoskeletal: Normal range of motion and neck supple.  Cardiovascular:     Rate and Rhythm: Normal rate and regular rhythm.     Heart sounds: Normal heart sounds. No murmur. No friction rub. No gallop.   Pulmonary:     Effort: Pulmonary effort is  normal. No respiratory distress.     Breath sounds: Normal breath sounds. No wheezing.  Abdominal:     General: Bowel sounds are normal. There is no distension.     Palpations: Abdomen is soft.     Tenderness: There is no abdominal tenderness.  Skin:    General: Skin is warm and dry.     Capillary Refill: Capillary refill takes less than 2 seconds.     Findings: No erythema or rash.  Neurological:     Mental Status: He is alert and oriented to person, place, and time.     Motor: No abnormal muscle tone.     Coordination: Coordination normal.  Psychiatric:        Behavior: Behavior normal.      ED Treatments / Results  Labs (all labs ordered are listed, but only abnormal results are displayed) Labs Reviewed  COMPREHENSIVE METABOLIC PANEL -  Abnormal; Notable for the following components:      Result Value   CO2 19 (*)    Total Protein 8.3 (*)    All other components within normal limits  CBC WITH DIFFERENTIAL/PLATELET - Abnormal; Notable for the following components:   WBC 14.1 (*)    RBC 6.09 (*)    Hemoglobin 18.7 (*)    HCT 56.4 (*)    Neutro Abs 9.3 (*)    Monocytes Absolute 1.2 (*)    Abs Immature Granulocytes 0.11 (*)    All other components within normal limits  LACTIC ACID, PLASMA  LACTIC ACID, PLASMA  URINALYSIS, ROUTINE W REFLEX MICROSCOPIC    EKG None  Radiology No results found.  Procedures Procedures (including critical care time)  Medications Ordered in ED Medications  sodium chloride 0.9 % bolus 1,000 mL (0 mLs Intravenous Stopped 01/25/19 2250)     Initial Impression / Assessment and Plan / ED Course  I have reviewed the triage vital signs and the nursing notes.  Pertinent labs & imaging results that were available during my care of the patient were reviewed by me and considered in my medical decision making (see chart for details).       Patient this point does show some the signs of dehydration on his laboratory testing.  He is hydrated here in the emergency department.  His white blood cell count is 14.1 but has no signs of illness or infection that would lead [place] to believe that this was the cause of the elevated white blood cells.  This could be related to his surgery and could be related to the possible dehydration noted on his laboratory testing.  Advised patient to return here as needed.  Told to follow-up with his eye doctor as directed.  Final Clinical Impressions(s) / ED Diagnoses   Final diagnoses:  Dehydration  Leukocytosis, unspecified type    ED Discharge Orders    None       Dalia Heading, PA-C 01/26/19 0021    Wyvonnia Dusky, MD 01/26/19 1352

## 2019-02-01 ENCOUNTER — Telehealth: Payer: Self-pay

## 2019-02-01 ENCOUNTER — Other Ambulatory Visit: Payer: Self-pay

## 2019-02-01 ENCOUNTER — Ambulatory Visit: Payer: 59 | Admitting: Cardiology

## 2019-02-01 ENCOUNTER — Encounter: Payer: Self-pay | Admitting: Cardiology

## 2019-02-01 VITALS — BP 152/105 | HR 107 | Ht 70.0 in | Wt 257.0 lb

## 2019-02-01 DIAGNOSIS — Z789 Other specified health status: Secondary | ICD-10-CM

## 2019-02-01 DIAGNOSIS — E785 Hyperlipidemia, unspecified: Secondary | ICD-10-CM

## 2019-02-01 DIAGNOSIS — Z7289 Other problems related to lifestyle: Secondary | ICD-10-CM

## 2019-02-01 DIAGNOSIS — R000 Tachycardia, unspecified: Secondary | ICD-10-CM | POA: Diagnosis not present

## 2019-02-01 DIAGNOSIS — R002 Palpitations: Secondary | ICD-10-CM

## 2019-02-01 DIAGNOSIS — R0602 Shortness of breath: Secondary | ICD-10-CM

## 2019-02-01 DIAGNOSIS — Z720 Tobacco use: Secondary | ICD-10-CM

## 2019-02-01 DIAGNOSIS — I10 Essential (primary) hypertension: Secondary | ICD-10-CM

## 2019-02-01 MED ORDER — ATENOLOL 50 MG PO TABS: 50.0000 mg | ORAL_TABLET | Freq: Every day | ORAL | 3 refills | Status: DC

## 2019-02-01 NOTE — Telephone Encounter (Signed)
Faxed notes to [facility] 

## 2019-02-01 NOTE — Telephone Encounter (Signed)
Notes on file from dr Benjamine Mola [phone] sent referral to scheduling

## 2019-02-01 NOTE — Patient Instructions (Signed)
Medication Instructions:  Increase Atenolol to 50 mg daily   Lab Work: None ordered   Testing/Procedures:  Schedule Echo  Schedule 3 day Zio Monitor  Follow-Up: At [facility], you and your health needs are our priority.  As part of our continuing mission to provide you with exceptional heart care, we have created designated Provider Care Teams.  These Care Teams include your primary Cardiologist (physician) and Advanced Practice Providers (APPs -  Physician Assistants and Nurse Practitioners) who all work together to provide you with the care you need, when you need it.  Your next appointment:  3 months   The format for your next appointment:  Office   Provider:  Dr.Schumann

## 2019-02-01 NOTE — Progress Notes (Signed)
Cardiology Office Note:    Date:  02/01/2019   ID:  Sean Page, DOB 1985/05/18, MRN [id number]  PCP:  Leighton Ruff, MD  Cardiologist:  No primary care provider on file.  Electrophysiologist:  None   Referring MD: [facility] Jerilynn Mages*   Chief Complaint  Patient presents with  . Tachycardia    History of Present Illness:    Sean Page is a 33 y.o. male with a hx of migraines, hypertension, tobacco use, alcohol use presents for an initial evaluation of tachycardia.  He denies any palpitations or chest pain.  Does report some dyspnea on exertion.  Has also noted lower extremity edema.  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  Since that time reports he has not been working and has been smoking and drinking more.  Currently smokes 1.5 to 2 packs/day.  Has smoked for 20 years.  States that he has been drinking whiskey, drinks over a pint per day.  Does have some gym equipment at his home and has been exercising intermittently, mostly stretching exercises.  He denies any exertional symptoms.  Started on atenolol 25 mg daily last week for hypertension.  Has monitor at home, has been running 150s/100s.  Sent to ED on 10/29 for leukocytosis.  No fevers.  Work-up was unremarkable.    Past Medical History:  Diagnosis Date  . Glaucoma   . Migraine     No past surgical history on file.  Current Medications: Current Meds  Medication Sig  . ibuprofen (ADVIL,MOTRIN) 800 MG tablet Take 1 tablet (800 mg total) by mouth every 8 (eight) hours as needed.  . [DISCONTINUED] atenolol (TENORMIN) 25 MG tablet Take 25 mg by mouth daily.     Allergies:   Patient has no known allergies.   Social History   Socioeconomic History  . Marital status: Single    Spouse name: Not on file  . Number of children: Not on file  . Years of education: Not on file  . Highest education level: Not on file  Occupational History  . Not on file  Social Needs  .  Financial resource strain: Not on file  . Food insecurity    Worry: Not on file    Inability: Not on file  . Transportation needs    Medical: Not on file    Non-medical: Not on file  Tobacco Use  . Smoking status: Current Every Day Smoker  . Smokeless tobacco: Never Used  Substance and Sexual Activity  . Alcohol use: No  . Drug use: No  . Sexual activity: Not on file  Lifestyle  . Physical activity    Days per week: Not on file    Minutes per session: Not on file  . Stress: Not on file  Relationships  . Social Herbalist on phone: Not on file    Gets together: Not on file    Attends religious service: Not on file    Active member of club or organization: Not on file    Attends meetings of clubs or organizations: Not on file    Relationship status: Not on file  Other Topics Concern  . Not on file  Social History Narrative  . Not on file     Family History: Father had stroke  ROS:   Please see the history of present illness.    All other systems reviewed and are negative.  EKGs/Labs/Other Studies Reviewed:  The following studies were reviewed today:   EKG:  EKG is ordered today.  The ekg ordered today demonstrates sinus tachycardia, rate 107, nonspecific <671mm ST depressions in leads II, aVF  Recent Labs: 01/25/2019: ALT 38; BUN 17; Creatinine, Ser 1.05; Hemoglobin 18.7; Platelets 291; Potassium 3.6; Sodium 139  Recent Lipid Panel No results found for: CHOL, TRIG, HDL, CHOLHDL, VLDL, LDLCALC, LDLDIRECT  Physical Exam:    VS:  BP (!) 152/105   Pulse (!) 107   Ht 5\' 10"  (1.778 m)   Wt 257 lb (116.6 kg)   SpO2 98%   BMI 36.88 kg/m     Wt Readings from Last 3 Encounters:  02/01/19 257 lb (116.6 kg)  01/25/19 250 lb (113.4 kg)  07/26/16 201 lb (91.2 kg)     GEN:  Well nourished, well developed in no acute distress HEENT: Normal NECK: No JVD; No carotid bruits LYMPHATICS: No lymphadenopathy CARDIAC: RRR, no murmurs, rubs, gallops  RESPIRATORY:  Clear to auscultation without rales, wheezing or rhonchi  ABDOMEN: Soft, non-tender, non-distended MUSCULOSKELETAL:  1+ LE edema; No deformity  SKIN: Warm and dry NEUROLOGIC:  Alert and oriented x 3 PSYCHIATRIC:  Normal affect   ASSESSMENT:    1. SOB (shortness of breath)   2. Palpitations   3. Tachycardia   4. Essential hypertension   5. Hyperlipidemia, unspecified hyperlipidemia type   6. Tobacco use   7. Alcohol use    PLAN:    Tachycardia: We will check Zio patch x3 days to evaluate heart rate trend and rule out arrhythmia  Dyspnea on exertion/ lower extremity edema: Check TTE to rule out structural heart disease  Hypertension: Recently started on atenolol 25 mg daily.  BP remains elevated, will increase to 50 mg daily  Hyperlipidemia: LDL 188 on 01/25/2019.  He is close to meeting indication for statin therapy.  He states that he would like to work on lifestyle changes including diet and exercise.  Tobacco use: Patient counseled on risks of tobacco use and cessation strongly encouraged  Alcohol use.  Reports that he drinks over a pint of whiskey daily.  Counseled on risks of alcohol use and recommended cessation.  RTC in 3 months  Medication Adjustments/Labs and Tests Ordered: Current medicines are reviewed at length with the patient today.  Concerns regarding medicines are outlined above.  Orders Placed This Encounter  Procedures  . LONG TERM MONITOR (3-14 DAYS)  . EKG 12-Lead  . ECHOCARDIOGRAM COMPLETE   Meds ordered this encounter  Medications  . atenolol (TENORMIN) 50 MG tablet    Sig: Take 1 tablet (50 mg total) by mouth daily.    Dispense:  90 tablet    Refill:  3    Patient Instructions  Medication Instructions:  Increase Atenolol to 50 mg daily   Lab Work: None ordered   Testing/Procedures:  Schedule Echo  Schedule 3 day Zio Monitor  Follow-Up: At [facility], you and your health needs are our priority.  As part of our  continuing mission to provide you with exceptional heart care, we have created designated Provider Care Teams.  These Care Teams include your primary Cardiologist (physician) and Advanced Practice Providers (APPs -  Physician Assistants and Nurse Practitioners) who all work together to provide you with the care you need, when you need it.  Your next appointment:  3 months   The format for your next appointment:  Office   Provider:  Dr.Mercadies Co        Signed, Tanna Savoyhristopher L  Bjorn Pippin, MD  02/01/2019 12:17 PM     [facility]

## 2019-02-13 ENCOUNTER — Telehealth: Payer: Self-pay | Admitting: *Deleted

## 2019-02-13 ENCOUNTER — Other Ambulatory Visit (HOSPITAL_COMMUNITY): Payer: 59

## 2019-02-13 NOTE — Telephone Encounter (Signed)
3 day ZIO XT long term holter monitor to be mailed to the patients home.  Instructions reviewed briefly as they are included in the monitor kit.  Patient is aware he cannot remove the patient for 3 days once applied. 

## 2019-02-16 ENCOUNTER — Other Ambulatory Visit (HOSPITAL_COMMUNITY): Payer: 59

## 2019-02-19 ENCOUNTER — Ambulatory Visit (INDEPENDENT_AMBULATORY_CARE_PROVIDER_SITE_OTHER): Payer: 59

## 2019-02-19 DIAGNOSIS — R002 Palpitations: Secondary | ICD-10-CM

## 2019-02-19 DIAGNOSIS — R0602 Shortness of breath: Secondary | ICD-10-CM | POA: Diagnosis not present

## 2019-03-01 ENCOUNTER — Other Ambulatory Visit: Payer: Self-pay

## 2019-03-01 ENCOUNTER — Encounter (HOSPITAL_COMMUNITY): Payer: Self-pay

## 2019-03-01 ENCOUNTER — Emergency Department (HOSPITAL_COMMUNITY)
Admission: EM | Admit: 2019-03-01 | Discharge: 2019-03-01 | Disposition: A | Discharge status: Home / Self Care | Payer: 59 | Attending: Emergency Medicine | Admitting: Emergency Medicine

## 2019-03-01 DIAGNOSIS — M5441 Lumbago with sciatica, right side: Secondary | ICD-10-CM | POA: Insufficient documentation

## 2019-03-01 DIAGNOSIS — M549 Dorsalgia, unspecified: Secondary | ICD-10-CM | POA: Diagnosis present

## 2019-03-01 DIAGNOSIS — F17200 Nicotine dependence, unspecified, uncomplicated: Secondary | ICD-10-CM | POA: Diagnosis not present

## 2019-03-01 DIAGNOSIS — I10 Essential (primary) hypertension: Secondary | ICD-10-CM | POA: Diagnosis not present

## 2019-03-01 DIAGNOSIS — M5431 Sciatica, right side: Secondary | ICD-10-CM

## 2019-03-01 HISTORY — DX: Essential (primary) hypertension: I10

## 2019-03-01 MED ORDER — DEXAMETHASONE SODIUM PHOSPHATE 10 MG/ML IJ SOLN
10.0000 mg | Freq: Once | INTRAMUSCULAR | Status: AC
  Administered 2019-03-01: 10 mg via INTRAMUSCULAR
  Filled 2019-03-01: qty 1

## 2019-03-01 MED ORDER — LIDOCAINE 5 % EX PTCH: 1.0000 | MEDICATED_PATCH | CUTANEOUS | 0 refills | Status: DC

## 2019-03-01 MED ORDER — MELOXICAM 15 MG PO TABS: 15.0000 mg | ORAL_TABLET | Freq: Every day | ORAL | 0 refills | Status: DC

## 2019-03-01 MED ORDER — METHOCARBAMOL 500 MG PO TABS: 500.0000 mg | ORAL_TABLET | Freq: Two times a day (BID) | ORAL | 0 refills | Status: DC

## 2019-03-01 MED ORDER — KETOROLAC TROMETHAMINE 60 MG/2ML IM SOLN
60.0000 mg | Freq: Once | INTRAMUSCULAR | Status: AC
  Administered 2019-03-01: 03:00:00 60 mg via INTRAMUSCULAR
  Filled 2019-03-01: qty 2

## 2019-03-01 MED ORDER — LIDOCAINE 5 % EX PTCH
1.0000 | MEDICATED_PATCH | CUTANEOUS | Status: DC
  Administered 2019-03-01: 1 via TRANSDERMAL
  Filled 2019-03-01: qty 1

## 2019-03-01 NOTE — ED Triage Notes (Addendum)
Patient arrived stating that he thinks he has a pinched nerve in his lower back. Stating on Sunday he began having pain when walking or attempting to stand up. Reports taking Flexeril and ibuprofen with little relief. Denies any loss of bowel or bladder. States he does have a shooting pain to his glute when he walks.

## 2019-03-01 NOTE — Discharge Instructions (Addendum)
Stop flexeril and stop ibuprofen

## 2019-03-01 NOTE — ED Provider Notes (Signed)
[facility] DEPT Provider Note   CSN: [id number] Arrival date & time: 03/01/19  0040     History   Chief Complaint Chief Complaint  Patient presents with  . Back Pain    HPI Sean Page is a 33 y.o. male.     The history is provided by the patient.  Back Pain Location:  Gluteal region and sacro-iliac joint Quality:  Cramping and shooting Pain severity:  Severe Pain is:  Same all the time Onset quality:  Gradual Duration:  4 days Timing:  Constant Progression:  Unchanged Chronicity:  Recurrent Context comment:  Playing with the dogs Relieved by:  Nothing Exacerbated by: walking. Ineffective treatments:  None tried Associated symptoms: no abdominal pain, no abdominal swelling, no bladder incontinence, no bowel incontinence, no chest pain, no dysuria, no fever, no headaches, no leg pain, no numbness, no paresthesias, no pelvic pain, no perianal numbness, no tingling, no weakness and no weight loss   Risk factors: no hx of cancer and no recent surgery   Patient was playing with dogs and had low back and gluteal pain it has continued.  Has taken flexeril without relief.  No f/c/r.  No weakness no numbness. No bowel or bladder incontinence.    Past Medical History:  Diagnosis Date  . Glaucoma   . Hypertension   . Migraine     Patient Active Problem List   Diagnosis Date Noted  . 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 No pertinent surgical history.      Home Medications    Prior to Admission medications   Medication Sig Start Date End Date Taking? Authorizing Provider  atenolol (TENORMIN) 50 MG tablet Take 1 tablet (50 mg total) by mouth daily. 02/01/19 05/02/19  Donato Heinz, MD  ibuprofen (ADVIL,MOTRIN) 800 MG tablet Take 1 tablet (800 mg total) by mouth every 8 (eight) hours as needed. 07/26/16   Pete Pelt, PA-C    Family History No family history on  file.  Social History Social History   Tobacco Use  . Smoking status: Current Every Day Smoker  . Smokeless tobacco: Never Used  Substance Use Topics  . Alcohol use: No  . Drug use: No     Allergies   Patient has no known allergies.   Review of Systems Review of Systems  Constitutional: Negative for fever and weight loss.  HENT: Negative for congestion.   Eyes: Negative for visual disturbance.  Respiratory: Negative for cough.   Cardiovascular: Negative for chest pain.  Gastrointestinal: Negative for abdominal pain and bowel incontinence.  Genitourinary: Negative for bladder incontinence, difficulty urinating, dysuria and pelvic pain.  Musculoskeletal: Positive for back pain. Negative for gait problem.  Neurological: Negative for tingling, weakness, numbness, headaches and paresthesias.  Psychiatric/Behavioral: Negative for agitation.  All other systems reviewed and are negative.    Physical Exam Updated Vital Signs BP (!) 173/100   Pulse 88   Temp 98 F (36.7 C) (Oral)   Resp 20   Ht 5\' 10"  (1.778 m)   Wt 115.2 kg   SpO2 99%   BMI 36.45 kg/m   Physical Exam Vitals signs and nursing note reviewed.  Constitutional:      General: He is not in acute distress.    Appearance: Normal appearance.  HENT:     Head: Normocephalic and atraumatic.     Nose: Nose normal.  Eyes:     Conjunctiva/sclera: Conjunctivae  normal.     Pupils: Pupils are equal, round, and reactive to light.  Neck:     Musculoskeletal: Normal range of motion and neck supple.  Cardiovascular:     Rate and Rhythm: Normal rate and regular rhythm.     Pulses: Normal pulses.     Heart sounds: Normal heart sounds.  Pulmonary:     Effort: Pulmonary effort is normal.     Breath sounds: Normal breath sounds.  Abdominal:     General: Abdomen is flat. Bowel sounds are normal.     Tenderness: There is no abdominal tenderness. There is no guarding.  Musculoskeletal: Normal range of motion.  Skin:     General: Skin is warm and dry.     Capillary Refill: Capillary refill takes less than 2 seconds.  Neurological:     General: No focal deficit present.     Mental Status: He is alert and oriented to person, place, and time.  Psychiatric:        Mood and Affect: Mood normal.        Behavior: Behavior normal.      ED Treatments / Results  Labs (all labs ordered are listed, but only abnormal results are displayed) Labs Reviewed - No data to display  EKG None  Radiology No results found.  Procedures Procedures (including critical care time)  Medications Ordered in ED Medications  ketorolac (TORADOL) injection 60 mg (has no administration in time range)  dexamethasone (DECADRON) injection 10 mg (has no administration in time range)  lidocaine (LIDODERM) 5 % 1 patch (has no administration in time range)     Initial Impression / Assessment and Plan / ED Course  Symptoms consistent with sciatica. There are no red flags, gait is intact as is sensation.  No indication for advanced imaging at this time.  I will treat with NSAIDs, robaxin and lidoderm.  Follow up with your family doctor for ongoing care.   Sean Page was evaluated in Emergency Department on 03/01/2019 for the symptoms described in the history of present illness. He was evaluated in the context of the global COVID-19 pandemic, which necessitated consideration that the patient might be at risk for infection with the SARS-CoV-2 virus that causes COVID-19. Institutional protocols and algorithms that pertain to the evaluation of patients at risk for COVID-19 are in a state of rapid change based on information released by regulatory bodies including the CDC and federal and state organizations. These policies and algorithms were followed during the patient's care in the ED.    Final Clinical Impressions(s) / ED Diagnoses   Return for intractable cough, coughing up blood,fevers >100.4 unrelieved by medication,  shortness of breath, intractable vomiting, chest pain, shortness of breath, weakness,numbness, changes in speech, facial asymmetry,abdominal pain, passing out,Inability to tolerate liquids or food, cough, altered mental status or any concerns. No signs of systemic illness or infection. The patient is nontoxic-appearing on exam and vital signs are within normal limits.   I have reviewed the triage vital signs and the nursing notes. Pertinent labs &imaging results that were available during my care of the patient were reviewed by me and considered in my medical decision making (see chart for details).  After history, exam, and medical workup I feel the patient has been appropriately medically screened and is safe for discharge home. Pertinent diagnoses were discussed with the patient. Patient was given return precautions       Halima Fogal, MD 03/01/19 [phone]

## 2019-03-13 ENCOUNTER — Other Ambulatory Visit: Payer: Self-pay

## 2019-03-13 ENCOUNTER — Ambulatory Visit (HOSPITAL_COMMUNITY): Payer: 59 | Attending: Cardiovascular Disease

## 2019-03-13 DIAGNOSIS — R0602 Shortness of breath: Secondary | ICD-10-CM | POA: Insufficient documentation

## 2019-03-13 DIAGNOSIS — R002 Palpitations: Secondary | ICD-10-CM | POA: Diagnosis present

## 2019-03-28 ENCOUNTER — Other Ambulatory Visit: Payer: Self-pay | Admitting: Student

## 2019-03-28 DIAGNOSIS — M5416 Radiculopathy, lumbar region: Secondary | ICD-10-CM

## 2019-04-08 ENCOUNTER — Other Ambulatory Visit: Payer: 59

## 2019-04-15 ENCOUNTER — Other Ambulatory Visit: Payer: Self-pay

## 2019-04-15 ENCOUNTER — Ambulatory Visit
Admission: RE | Admit: 2019-04-15 | Discharge: 2019-04-15 | Disposition: A | Discharge status: Home / Self Care | Payer: 59 | Source: Ambulatory Visit | Attending: Student | Admitting: Student

## 2019-04-15 DIAGNOSIS — M5416 Radiculopathy, lumbar region: Secondary | ICD-10-CM

## 2019-05-13 NOTE — Progress Notes (Signed)
Cardiology Office Note:    Date:  05/14/2019   ID:  Sean Page, DOB 27-May-1985, MRN [id number]  PCP:  Juluis Rainier, MD  Cardiologist:  No primary care provider on file.  Electrophysiologist:  None   Referring MD: Juluis Rainier, MD   Chief Complaint  Patient presents with  . Hypertension    History of Present Illness:    Sean Page is a 34 y.o. male with a hx of migraines, hypertension, tobacco use, alcohol use presents for a follow-up visit.  He was initially seen on 02/01/2019 for an evaluation of tachycardia.  He denies any palpitations or chest pain.  Does report some dyspnea on exertion.  Has also noted lower extremity edema.  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  Since that time reports he has not been working and has been smoking and drinking more.  Smokinh 1.5 to 2 packs/day.  Has smoked for 20 years.  States that he has been drinking whiskey, drinks over a pint per day.  Started on atenolol 25 mg daily a week prior for hypertension.    At initial clinic visit, atenolol was increased to 50 mg daily.  TTE was ordered given his dyspnea on exertion, which showed no significant abnormalities.  Given his tachycardia, a Zio patch x3 days was ordered, which showed no significant arrhythmias.  Since last clinic visit, he reports that he is doing well.  He started back at work 2 weeks ago.  States that he is drinking less.  Was drinking over a pint per day, now down to a fifth of whiskey per weekend.  Smoking is reduced from 1.5-2 packs/day down to one 1-1.5 pack/day.  Is not interested in quitting at this time.  Has not been exercising but is on his feet all day at work.  Blood pressure has been elevated, has been 1502/ 100s -110s at home.  Weight is up 18 pounds from last clinic appointment.  Reports episodes of tachycardia have improved.  Symptoms of heart racing when lying down at night.  Occurs about twice per month.  Continues to  have intermittent dyspnea on exertion.     Past Medical History:  Diagnosis Date  . Glaucoma   . Hypertension   . Migraine     No past surgical history on file.  Current Medications: Current Meds  Medication Sig  . atenolol (TENORMIN) 50 MG tablet Take 1 tablet (50 mg total) by mouth daily.  Marland Kitchen ibuprofen (ADVIL,MOTRIN) 800 MG tablet Take 1 tablet (800 mg total) by mouth every 8 (eight) hours as needed.  . lidocaine (LIDODERM) 5 % Place 1 patch onto the skin daily. Remove & Discard patch within 12 hours or as directed by MD  . meloxicam (MOBIC) 15 MG tablet Take 1 tablet (15 mg total) by mouth daily. No ibuprofen  . methocarbamol (ROBAXIN) 500 MG tablet Take 1 tablet (500 mg total) by mouth 2 (two) times daily. Stop using flexeril     Allergies:   Patient has no known allergies.   Social History   Socioeconomic History  . Marital status: Single    Spouse name: Not on file  . Number of children: Not on file  . Years of education: Not on file  . Highest education level: Not on file  Occupational History  . Not on file  Tobacco Use  . Smoking status: Current Every Day Smoker  . Smokeless tobacco: Never Used  Substance and  Sexual Activity  . Alcohol use: No  . Drug use: No  . Sexual activity: Not on file  Other Topics Concern  . Not on file  Social History Narrative  . Not on file   Social Determinants of Health   Financial Resource Strain:   . Difficulty of Paying Living Expenses: Not on file  Food Insecurity:   . Worried About Charity fundraiser in the Last Year: Not on file  . Ran Out of Food in the Last Year: Not on file  Transportation Needs:   . Lack of Transportation (Medical): Not on file  . Lack of Transportation (Non-Medical): Not on file  Physical Activity:   . Days of Exercise per Week: Not on file  . Minutes of Exercise per Session: Not on file  Stress:   . Feeling of Stress : Not on file  Social Connections:   . Frequency of Communication with  Friends and Family: Not on file  . Frequency of Social Gatherings with Friends and Family: Not on file  . Attends Religious Services: Not on file  . Active Member of Clubs or Organizations: Not on file  . Attends Archivist Meetings: Not on file  . Marital Status: Not on file     Family History: Father had stroke  ROS:   Please see the history of present illness.    All other systems reviewed and are negative.  EKGs/Labs/Other Studies Reviewed:    The following studies were reviewed today:   EKG:  EKG is not ordered today.  The last ekg ordered demonstrates sinus tachycardia, rate 107, nonspecific <25mm ST depressions in leads II, aVF  Recent Labs: 01/25/2019: ALT 38; BUN 17; Creatinine, Ser 1.05; Hemoglobin 18.7; Platelets 291; Potassium 3.6; Sodium 139  Recent Lipid Panel No results found for: CHOL, TRIG, HDL, CHOLHDL, VLDL, LDLCALC, LDLDIRECT  Physical Exam:    VS:  BP (!) 155/110   Pulse 85   Ht 5\' 10"  (1.778 m)   Wt 275 lb (124.7 kg)   SpO2 98%   BMI 39.46 kg/m     Wt Readings from Last 3 Encounters:  05/14/19 275 lb (124.7 kg)  03/01/19 254 lb (115.2 kg)  02/01/19 257 lb (116.6 kg)     GEN:  Well nourished, well developed in no acute distress HEENT: Normal NECK: No JVD; No carotid bruits LYMPHATICS: No lymphadenopathy CARDIAC: RRR, no murmurs, rubs, gallops RESPIRATORY:  Clear to auscultation without rales, wheezing or rhonchi  ABDOMEN: Soft, non-tender, non-distended MUSCULOSKELETAL:  1+ LE edema; No deformity  SKIN: Warm and dry NEUROLOGIC:  Alert and oriented x 3 PSYCHIATRIC:  Normal affect   TTE 03/13/19: 1. Left ventricular ejection fraction, by visual estimation, is 55 to  60%. The left ventricle has normal function. There is no left ventricular  hypertrophy.  2. The left ventricle has no regional wall motion abnormalities.  3. Global right ventricle has normal systolic function.The right  ventricular size is normal. No increase in  right ventricular wall  thickness.  4. Left atrial size was normal.  5. Right atrial size was normal.  6. Presence of pericardial fat pad.  7. Trivial pericardial effusion is present.  8. The mitral valve is grossly normal. Trivial mitral valve  regurgitation.  9. The tricuspid valve is grossly normal. Tricuspid valve regurgitation  is trivial.  10. The aortic valve is tricuspid. Aortic valve regurgitation is not  visualized. No evidence of aortic valve sclerosis or stenosis.  11. The pulmonic  valve was grossly normal. Pulmonic valve regurgitation is  not visualized.  12. Normal pulmonary artery systolic pressure.  13. The tricuspid regurgitant velocity is 2.46 m/s, and with an assumed  right atrial pressure of 3 mmHg, the estimated right ventricular systolic  pressure is normal at 27.2 mmHg.  14. The inferior vena cava is normal in size with greater than 50%  respiratory variability, suggesting right atrial pressure of 3 mmHg.  15. No prior Echocardiogram.  16. The interatrial septum appears to be lipomatous.   Cardiac monitor 03/11/19:  No significant arrhythmias   3 days of data recorded on Zio monitor. Patient had a min HR of 46 bpm, max HR of 132 bpm, and avg HR of 92 bpm. Predominant underlying rhythm was Sinus Rhythm. No VT, SVT, atrial fibrillation, high degree block, or pauses noted. Isolated atrial and ventricular ectopy was rare (<1%). There were no triggered events. No significant arrhythmias detected.  ASSESSMENT:    1. Tachycardia   2. DOE (dyspnea on exertion)   3. Essential hypertension   4. Hyperlipidemia, unspecified hyperlipidemia type   5. Tobacco use   6. Alcohol use    PLAN:    Tachycardia: Zio patch showed no significant arrhythmias  Dyspnea on exertion/ lower extremity edema: TTE shows no structural heart disease  Hypertension: On atenolol 50 mg daily.  BP remains elevated, will add amlodipine 5 mg daily.  Asked patient to monitor blood  pressure daily and call in next 2 weeks with results.  Hyperlipidemia: LDL 188 on 01/25/2019.  He is close to meeting indication for statin therapy.  He states that he would like to work on lifestyle changes including diet and exercise.  Tobacco use: Patient counseled on risks of tobacco use and cessation strongly encouraged  Alcohol use.  Reports that he was drinking over a pint of whiskey daily, now that he has started working again is drinking a fifth of whiskey per weekend.  Counseled on risks of alcohol use and recommended cessation.  RTC in 3 months  Medication Adjustments/Labs and Tests Ordered: Current medicines are reviewed at length with the patient today.  Concerns regarding medicines are outlined above.  No orders of the defined types were placed in this encounter.  Meds ordered this encounter  Medications  . amLODipine (NORVASC) 5 MG tablet    Sig: Take 1 tablet (5 mg total) by mouth daily.    Dispense:  90 tablet    Refill:  3    Patient Instructions  Medication Instructions:  START amlodipine (Norvasc) 5 mg daily  *If you need a refill on your cardiac medications before your next appointment, please call your pharmacy*  Lab Work: NONE  Testing/Procedures: NONE  Follow-Up: At [facility], you and your health needs are our priority.  As part of our continuing mission to provide you with exceptional heart care, we have created designated Provider Care Teams.  These Care Teams include your primary Cardiologist (physician) and Advanced Practice Providers (APPs -  Physician Assistants and Nurse Practitioners) who all work together to provide you with the care you need, when you need it.  Your next appointment:   3 month(s)  The format for your next appointment:   In Person  Provider:   Epifanio Lesches, MD  Other Instructions Take blood pressure at home daily and write it down-call in 2 weeks to report readings     Signed, Little Ishikawa, MD   05/14/2019 5:39 PM    [facility]

## 2019-05-14 ENCOUNTER — Ambulatory Visit: Payer: 59 | Admitting: Cardiology

## 2019-05-14 ENCOUNTER — Encounter: Payer: Self-pay | Admitting: Cardiology

## 2019-05-14 ENCOUNTER — Other Ambulatory Visit: Payer: Self-pay

## 2019-05-14 VITALS — BP 155/110 | HR 85 | Ht 70.0 in | Wt 275.0 lb

## 2019-05-14 DIAGNOSIS — E785 Hyperlipidemia, unspecified: Secondary | ICD-10-CM

## 2019-05-14 DIAGNOSIS — R000 Tachycardia, unspecified: Secondary | ICD-10-CM

## 2019-05-14 DIAGNOSIS — R0609 Other forms of dyspnea: Secondary | ICD-10-CM

## 2019-05-14 DIAGNOSIS — I10 Essential (primary) hypertension: Secondary | ICD-10-CM

## 2019-05-14 DIAGNOSIS — Z720 Tobacco use: Secondary | ICD-10-CM

## 2019-05-14 DIAGNOSIS — R0600 Dyspnea, unspecified: Secondary | ICD-10-CM

## 2019-05-14 DIAGNOSIS — Z7289 Other problems related to lifestyle: Secondary | ICD-10-CM

## 2019-05-14 DIAGNOSIS — Z789 Other specified health status: Secondary | ICD-10-CM

## 2019-05-14 MED ORDER — AMLODIPINE BESYLATE 5 MG PO TABS: 5.0000 mg | ORAL_TABLET | Freq: Every day | ORAL | 3 refills | Status: DC

## 2019-05-14 NOTE — Patient Instructions (Signed)
Medication Instructions:  START amlodipine (Norvasc) 5 mg daily  *If you need a refill on your cardiac medications before your next appointment, please call your pharmacy*  Lab Work: NONE  Testing/Procedures: NONE  Follow-Up: At [facility], you and your health needs are our priority.  As part of our continuing mission to provide you with exceptional heart care, we have created designated Provider Care Teams.  These Care Teams include your primary Cardiologist (physician) and Advanced Practice Providers (APPs -  Physician Assistants and Nurse Practitioners) who all work together to provide you with the care you need, when you need it.  Your next appointment:   3 month(s)  The format for your next appointment:   In Person  Provider:   Epifanio Lesches, MD  Other Instructions Take blood pressure at home daily and write it down-call in 2 weeks to report readings

## 2019-08-17 ENCOUNTER — Ambulatory Visit (INDEPENDENT_AMBULATORY_CARE_PROVIDER_SITE_OTHER): Payer: 59 | Admitting: Cardiology

## 2019-08-17 ENCOUNTER — Other Ambulatory Visit: Payer: Self-pay

## 2019-08-17 ENCOUNTER — Ambulatory Visit: Payer: 59 | Admitting: Cardiology

## 2019-08-17 ENCOUNTER — Encounter: Payer: Self-pay | Admitting: Cardiology

## 2019-08-17 VITALS — BP 140/96 | HR 96 | Temp 97.0°F | Ht 70.0 in | Wt 270.0 lb

## 2019-08-17 DIAGNOSIS — I10 Essential (primary) hypertension: Secondary | ICD-10-CM | POA: Diagnosis not present

## 2019-08-17 DIAGNOSIS — R400 Somnolence: Secondary | ICD-10-CM

## 2019-08-17 DIAGNOSIS — R0600 Dyspnea, unspecified: Secondary | ICD-10-CM | POA: Diagnosis not present

## 2019-08-17 DIAGNOSIS — R000 Tachycardia, unspecified: Secondary | ICD-10-CM | POA: Diagnosis not present

## 2019-08-17 DIAGNOSIS — R0609 Other forms of dyspnea: Secondary | ICD-10-CM

## 2019-08-17 DIAGNOSIS — Z7289 Other problems related to lifestyle: Secondary | ICD-10-CM

## 2019-08-17 DIAGNOSIS — Z789 Other specified health status: Secondary | ICD-10-CM

## 2019-08-17 DIAGNOSIS — Z720 Tobacco use: Secondary | ICD-10-CM

## 2019-08-17 MED ORDER — AMLODIPINE BESYLATE 10 MG PO TABS: 10.0000 mg | ORAL_TABLET | Freq: Every day | ORAL | 3 refills | Status: DC

## 2019-08-17 NOTE — Progress Notes (Signed)
Cardiology Office Note:    Date:  08/19/2019   ID:  Sean Page, DOB 09/07/1985, MRN [id number]  PCP:  Juluis Rainier, MD  Cardiologist:  No primary care provider on file.  Electrophysiologist:  None   Referring MD: Juluis Rainier, MD   Chief Complaint  Patient presents with  . Hypertension    History of Present Illness:    Sean Page is a 34 y.o. male with a hx of migraines, hypertension, tobacco use, alcohol use presents for a follow-up visit.  He was initially seen on 02/01/2019 for an evaluation of tachycardia.  He denies any palpitations or chest pain.  Does report some dyspnea on exertion.  Has also noted lower extremity edema.  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  Since that time reports he has not been working and has been smoking and drinking more.  He was smoking 1.5 to 2 packs/day.  Has smoked for 20 years.  States that he was drinking whiskey, drinks over a pint per day.  Started on atenolol 25 mg daily a week prior for hypertension.    At initial clinic visit, atenolol was increased to 50 mg daily.  TTE was ordered given his dyspnea on exertion, which showed no significant abnormalities.  Given his tachycardia, a Zio patch x3 days was ordered, which showed no significant arrhythmias.  Since last clinic visit, he reports that he has been doing well.  He denies any chest pain or dyspnea.  Does report he has intermittent indigestion that resolves with antacids.  Denies any exertional chest pain.  No lightheadedness, syncope, palpitations.  States that he walks a lot at his job but otherwise does not get regular exercise.  Does do yard work.  Weight is down 5 pounds in 3 months.  He continues to drink about a fifth/week.  Continues to smoke about 1.5 packs/day.  Reports BP at home has been 120s to 130s over 80s to 90s.     Past Medical History:  Diagnosis Date  . Glaucoma   . Hypertension   . Migraine     No past  surgical history on file.  Current Medications: Current Meds  Medication Sig  . ibuprofen (ADVIL,MOTRIN) 800 MG tablet Take 1 tablet (800 mg total) by mouth every 8 (eight) hours as needed.  . lidocaine (LIDODERM) 5 % Place 1 patch onto the skin daily. Remove & Discard patch within 12 hours or as directed by MD  . meloxicam (MOBIC) 15 MG tablet Take 1 tablet (15 mg total) by mouth daily. No ibuprofen  . methocarbamol (ROBAXIN) 500 MG tablet Take 1 tablet (500 mg total) by mouth 2 (two) times daily. Stop using flexeril  . prednisoLONE acetate (PRED FORTE) 1 % ophthalmic suspension Place 1 drop into the left eye 4 (four) times daily.  . timolol (TIMOPTIC) 0.5 % ophthalmic solution Place 1 drop into the left eye 2 (two) times daily.     Allergies:   Patient has no known allergies.   Social History   Socioeconomic History  . Marital status: Single    Spouse name: Not on file  . Number of children: Not on file  . Years of education: Not on file  . Highest education level: Not on file  Occupational History  . Not on file  Tobacco Use  . Smoking status: Current Every Day Smoker  . Smokeless tobacco: Never Used  Substance and Sexual Activity  . Alcohol use:  No  . Drug use: No  . Sexual activity: Not on file  Other Topics Concern  . Not on file  Social History Narrative  . Not on file   Social Determinants of Health   Financial Resource Strain:   . Difficulty of Paying Living Expenses:   Food Insecurity:   . Worried About Programme researcher, broadcasting/film/video in the Last Year:   . Barista in the Last Year:   Transportation Needs:   . Freight forwarder (Medical):   Marland Kitchen Lack of Transportation (Non-Medical):   Physical Activity:   . Days of Exercise per Week:   . Minutes of Exercise per Session:   Stress:   . Feeling of Stress :   Social Connections:   . Frequency of Communication with Friends and Family:   . Frequency of Social Gatherings with Friends and Family:   . Attends  Religious Services:   . Active Member of Clubs or Organizations:   . Attends Banker Meetings:   Marland Kitchen Marital Status:      Family History: Father had stroke  ROS:   Please see the history of present illness.    All other systems reviewed and are negative.  EKGs/Labs/Other Studies Reviewed:    The following studies were reviewed today:   EKG:  EKG is not ordered today.  The last ekg ordered demonstrates sinus tachycardia, rate 107, nonspecific <51mm ST depressions in leads II, aVF  Recent Labs: 01/25/2019: ALT 38; BUN 17; Creatinine, Ser 1.05; Hemoglobin 18.7; Platelets 291; Potassium 3.6; Sodium 139  Recent Lipid Panel No results found for: CHOL, TRIG, HDL, CHOLHDL, VLDL, LDLCALC, LDLDIRECT  Physical Exam:    VS:  BP (!) 140/96   Pulse 96   Temp (!) 97 F (36.1 C)   Ht 5\' 10"  (1.778 m)   Wt 270 lb (122.5 kg)   SpO2 95%   BMI 38.74 kg/m     Wt Readings from Last 3 Encounters:  08/17/19 270 lb (122.5 kg)  05/14/19 275 lb (124.7 kg)  03/01/19 254 lb (115.2 kg)     GEN:  Well nourished, well developed in no acute distress HEENT: Normal NECK: No JVD; No carotid bruits LYMPHATICS: No lymphadenopathy CARDIAC: RRR, no murmurs, rubs, gallops RESPIRATORY:  Clear to auscultation without rales, wheezing or rhonchi  ABDOMEN: Soft, non-tender, non-distended MUSCULOSKELETAL:  1+ LE edema; No deformity  SKIN: Warm and dry NEUROLOGIC:  Alert and oriented x 3 PSYCHIATRIC:  Normal affect   TTE 03/13/19: 1. Left ventricular ejection fraction, by visual estimation, is 55 to  60%. The left ventricle has normal function. There is no left ventricular  hypertrophy.  2. The left ventricle has no regional wall motion abnormalities.  3. Global right ventricle has normal systolic function.The right  ventricular size is normal. No increase in right ventricular wall  thickness.  4. Left atrial size was normal.  5. Right atrial size was normal.  6. Presence of  pericardial fat pad.  7. Trivial pericardial effusion is present.  8. The mitral valve is grossly normal. Trivial mitral valve  regurgitation.  9. The tricuspid valve is grossly normal. Tricuspid valve regurgitation  is trivial.  10. The aortic valve is tricuspid. Aortic valve regurgitation is not  visualized. No evidence of aortic valve sclerosis or stenosis.  11. The pulmonic valve was grossly normal. Pulmonic valve regurgitation is  not visualized.  12. Normal pulmonary artery systolic pressure.  13. The tricuspid regurgitant velocity is 2.46  m/s, and with an assumed  right atrial pressure of 3 mmHg, the estimated right ventricular systolic  pressure is normal at 27.2 mmHg.  14. The inferior vena cava is normal in size with greater than 50%  respiratory variability, suggesting right atrial pressure of 3 mmHg.  15. No prior Echocardiogram.  16. The interatrial septum appears to be lipomatous.   Cardiac monitor 03/11/19:  No significant arrhythmias   3 days of data recorded on Zio monitor. Patient had a min HR of 46 bpm, max HR of 132 bpm, and avg HR of 92 bpm. Predominant underlying rhythm was Sinus Rhythm. No VT, SVT, atrial fibrillation, high degree block, or pauses noted. Isolated atrial and ventricular ectopy was rare (<1%). There were no triggered events. No significant arrhythmias detected.  ASSESSMENT:    1. Essential hypertension   2. Daytime somnolence   3. DOE (dyspnea on exertion)   4. Tachycardia   5. Tobacco use   6. Alcohol use    PLAN:    Hypertension: On atenolol 50 mg daily and amlodipine 5 mg daily.  BP remains elevated, will increase to amlodipine 10 mg daily.  Asked patient to monitor blood pressure daily and call in next 2 weeks with results.  Suspect untreated OSA likely contributing to poorly controlled blood pressure, will check sleep study  Tachycardia: Zio patch showed no significant arrhythmias  Dyspnea on exertion/ lower extremity edema: TTE  shows no structural heart disease  Hyperlipidemia: LDL 188 on 01/25/2019.  He is close to meeting indication for statin therapy.  He states that he would like to work on lifestyle changes including diet and exercise.  Tobacco use: Patient counseled on risks of tobacco use and cessation strongly encouraged  Alcohol use.  Reports that he was drinking over a pint of whiskey daily, now that he has started working again is drinking a fifth of whiskey per week.  Counseled on risks of alcohol use and recommended cessation.  Daytime somnolence: Check sleep study as above  RTC in 6 months  Medication Adjustments/Labs and Tests Ordered: Current medicines are reviewed at length with the patient today.  Concerns regarding medicines are outlined above.  Orders Placed This Encounter  Procedures  . Split night study   Meds ordered this encounter  Medications  . amLODipine (NORVASC) 10 MG tablet    Sig: Take 1 tablet (10 mg total) by mouth daily.    Dispense:  90 tablet    Refill:  3    Dose increase    Patient Instructions  Medication Instructions:  INCREASE amlodipine to 10 mg daily  *If you need a refill on your cardiac medications before your next appointment, please call your pharmacy*  Testing/Procedures: Your physician has recommended that you have a sleep study. This test records several body functions during sleep, including: brain activity, eye movement, oxygen and carbon dioxide blood levels, heart rate and rhythm, breathing rate and rhythm, the flow of air through your mouth and nose, snoring, body muscle movements, and chest and belly movement.   Follow-Up: At [facility], you and your health needs are our priority.  As part of our continuing mission to provide you with exceptional heart care, we have created designated Provider Care Teams.  These Care Teams include your primary Cardiologist (physician) and Advanced Practice Providers (APPs -  Physician Assistants and Nurse  Practitioners) who all work together to provide you with the care you need, when you need it.  We recommend signing up for the  patient portal called "MyChart".  Sign up information is provided on this After Visit Summary.  MyChart is used to connect with patients for Virtual Visits (Telemedicine).  Patients are able to view lab/test results, encounter notes, upcoming appointments, etc.  Non-urgent messages can be sent to your provider as well.   To learn more about what you can do with MyChart, go to [email or web address].    Your next appointment:   6 month(s)  The format for your next appointment:   In Person  Provider:   Epifanio Lesches, MD     Please check your blood pressure at home daily, write it down.  Call the office of send message via Mychart with the readings in 2 weeks for Dr. Bjorn Pippin to review.      Signed, Little Ishikawa, MD  08/19/2019 2:28 PM    [facility]

## 2019-08-17 NOTE — Patient Instructions (Addendum)
Medication Instructions:  INCREASE amlodipine to 10 mg daily  *If you need a refill on your cardiac medications before your next appointment, please call your pharmacy*  Testing/Procedures: Your physician has recommended that you have a sleep study. This test records several body functions during sleep, including: brain activity, eye movement, oxygen and carbon dioxide blood levels, heart rate and rhythm, breathing rate and rhythm, the flow of air through your mouth and nose, snoring, body muscle movements, and chest and belly movement.   Follow-Up: At [facility], you and your health needs are our priority.  As part of our continuing mission to provide you with exceptional heart care, we have created designated Provider Care Teams.  These Care Teams include your primary Cardiologist (physician) and Advanced Practice Providers (APPs -  Physician Assistants and Nurse Practitioners) who all work together to provide you with the care you need, when you need it.  We recommend signing up for the patient portal called "MyChart".  Sign up information is provided on this After Visit Summary.  MyChart is used to connect with patients for Virtual Visits (Telemedicine).  Patients are able to view lab/test results, encounter notes, upcoming appointments, etc.  Non-urgent messages can be sent to your provider as well.   To learn more about what you can do with MyChart, go to [email or web address].    Your next appointment:   6 month(s)  The format for your next appointment:   In Person  Provider:   Epifanio Lesches, MD     Please check your blood pressure at home daily, write it down.  Call the office of send message via Mychart with the readings in 2 weeks for Dr. Bjorn Pippin to review.

## 2020-02-04 ENCOUNTER — Other Ambulatory Visit: Payer: Self-pay | Admitting: Cardiology

## 2020-02-04 NOTE — Telephone Encounter (Signed)
*  STAT* If patient is at the pharmacy, call can be transferred to refill team.   1. Which medications need to be refilled? (please list name of each medication and dose if known)   atenolol (TENORMIN) 50 MG tablet   2. Which pharmacy/location (including street and city if local pharmacy) is medication to be sent to? [facility] [address] [place], [place] - [address]  3. Do they need a 30 day or 90 day supply? 90

## 2020-02-05 ENCOUNTER — Other Ambulatory Visit: Payer: Self-pay | Admitting: Cardiology

## 2020-02-17 NOTE — Progress Notes (Signed)
Cardiology Office Note:    Date:  02/18/2020   ID:  Sean Page, DOB 05-Sep-1985, MRN [id number]  PCP:  Juluis Rainier, MD  Cardiologist:  No primary care provider on file.  Electrophysiologist:  None   Referring MD: Juluis Rainier, MD   Chief Complaint  Patient presents with  . Hypertension    History of Present Illness:    Sean Page is a 34 y.o. male with a hx of migraines, hypertension, tobacco use, alcohol use presents for a follow-up visit.  He was initially seen on 02/01/2019 for an evaluation of tachycardia.  He denies any palpitations or chest pain.  Does report some dyspnea on exertion.  Has also noted lower extremity edema.  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  Since that time reports he has not been working and has been smoking and drinking more.  He was smoking 1.5 to 2 packs/day.  Has smoked for 20 years.  States that he was drinking whiskey, drinks over a pint per day.  Started on atenolol 25 mg daily a week prior for hypertension.    At initial clinic visit, atenolol was increased to 50 mg daily.  TTE was ordered given his dyspnea on exertion, which showed no significant abnormalities.  Given his tachycardia, a Zio patch x3 days was ordered, which showed no significant arrhythmias.  Since last clinic visit, reports has been doing well.  Reports occasional left-sided chest pain that lasts a few seconds and resolves.  Denies any dyspnea, except when asked to walk up 6 flights of stairs at work.  Denies palpitations, lightheadedness, or syncope.  Does report he has some lower extremity edema particularly at the end of the day.  Has not been exercising outside of work, but walks a lot at his job.  Continues to smoke 1.5 packs/day.  Continues to drink about 1/5/week.  Did not bring his BP log with him today.     Past Medical History:  Diagnosis Date  . Glaucoma   . Hypertension   . Migraine     No past surgical  history on file.  Current Medications: Current Meds  Medication Sig  . amLODipine (NORVASC) 10 MG tablet Take 1 tablet (10 mg total) by mouth daily.  Marland Kitchen atenolol (TENORMIN) 50 MG tablet Take 1 tablet by mouth once daily  . ibuprofen (ADVIL,MOTRIN) 800 MG tablet Take 1 tablet (800 mg total) by mouth every 8 (eight) hours as needed.  . lidocaine (LIDODERM) 5 % Place 1 patch onto the skin daily. Remove & Discard patch within 12 hours or as directed by MD  . meloxicam (MOBIC) 15 MG tablet Take 1 tablet (15 mg total) by mouth daily. No ibuprofen  . methocarbamol (ROBAXIN) 500 MG tablet Take 1 tablet (500 mg total) by mouth 2 (two) times daily. Stop using flexeril  . prednisoLONE acetate (PRED FORTE) 1 % ophthalmic suspension Place 1 drop into the left eye 4 (four) times daily.  . timolol (TIMOPTIC) 0.5 % ophthalmic solution Place 1 drop into the left eye 2 (two) times daily.     Allergies:   Patient has no known allergies.   Social History   Socioeconomic History  . Marital status: Single    Spouse name: Not on file  . Number of children: Not on file  . Years of education: Not on file  . Highest education level: Not on file  Occupational History  . Not on file  Tobacco Use  . Smoking status: Current Every Day Smoker  . Smokeless tobacco: Never Used  Substance and Sexual Activity  . Alcohol use: No  . Drug use: No  . Sexual activity: Not on file  Other Topics Concern  . Not on file  Social History Narrative  . Not on file   Social Determinants of Health   Financial Resource Strain:   . Difficulty of Paying Living Expenses: Not on file  Food Insecurity:   . Worried About Programme researcher, broadcasting/film/video in the Last Year: Not on file  . Ran Out of Food in the Last Year: Not on file  Transportation Needs:   . Lack of Transportation (Medical): Not on file  . Lack of Transportation (Non-Medical): Not on file  Physical Activity:   . Days of Exercise per Week: Not on file  . Minutes of  Exercise per Session: Not on file  Stress:   . Feeling of Stress : Not on file  Social Connections:   . Frequency of Communication with Friends and Family: Not on file  . Frequency of Social Gatherings with Friends and Family: Not on file  . Attends Religious Services: Not on file  . Active Member of Clubs or Organizations: Not on file  . Attends Banker Meetings: Not on file  . Marital Status: Not on file     Family History: Father had stroke  ROS:   Please see the history of present illness.    All other systems reviewed and are negative.  EKGs/Labs/Other Studies Reviewed:    The following studies were reviewed today:   EKG:  EKG is ordered today.  The ekg ordered demonstrates sinus rhythm, rate 76  Recent Labs: 02/18/2020: ALT 24; BNP WILL FOLLOW; BUN 14; Creatinine, Ser 0.76; Potassium 4.3; Sodium 142; TSH 2.030  Recent Lipid Panel    Component Value Date/Time   CHOL 230 (H) 02/18/2020 1122   TRIG 163 (H) 02/18/2020 1122   HDL 47 02/18/2020 1122   CHOLHDL 4.9 02/18/2020 1122   LDLCALC 153 (H) 02/18/2020 1122    Physical Exam:    VS:  BP 130/88   Pulse 76   Ht 5\' 10"  (1.778 m)   Wt 278 lb 6.4 oz (126.3 kg)   SpO2 97%   BMI 39.95 kg/m     Wt Readings from Last 3 Encounters:  02/18/20 278 lb 6.4 oz (126.3 kg)  08/17/19 270 lb (122.5 kg)  05/14/19 275 lb (124.7 kg)     GEN:  Well nourished, well developed in no acute distress HEENT: Normal NECK: No JVD; No carotid bruits LYMPHATICS: No lymphadenopathy CARDIAC: RRR, no murmurs, rubs, gallops RESPIRATORY:  Clear to auscultation without rales, wheezing or rhonchi  ABDOMEN: Soft, non-tender, non-distended MUSCULOSKELETAL:  1+ LE edema; No deformity  SKIN: Warm and dry NEUROLOGIC:  Alert and oriented x 3 PSYCHIATRIC:  Normal affect   TTE 03/13/19: 1. Left ventricular ejection fraction, by visual estimation, is 55 to  60%. The left ventricle has normal function. There is no left ventricular   hypertrophy.  2. The left ventricle has no regional wall motion abnormalities.  3. Global right ventricle has normal systolic function.The right  ventricular size is normal. No increase in right ventricular wall  thickness.  4. Left atrial size was normal.  5. Right atrial size was normal.  6. Presence of pericardial fat pad.  7. Trivial pericardial effusion is present.  8. The mitral valve is grossly normal. Trivial mitral  valve  regurgitation.  9. The tricuspid valve is grossly normal. Tricuspid valve regurgitation  is trivial.  10. The aortic valve is tricuspid. Aortic valve regurgitation is not  visualized. No evidence of aortic valve sclerosis or stenosis.  11. The pulmonic valve was grossly normal. Pulmonic valve regurgitation is  not visualized.  12. Normal pulmonary artery systolic pressure.  13. The tricuspid regurgitant velocity is 2.46 m/s, and with an assumed  right atrial pressure of 3 mmHg, the estimated right ventricular systolic  pressure is normal at 27.2 mmHg.  14. The inferior vena cava is normal in size with greater than 50%  respiratory variability, suggesting right atrial pressure of 3 mmHg.  15. No prior Echocardiogram.  16. The interatrial septum appears to be lipomatous.   Cardiac monitor 03/11/19:  No significant arrhythmias   3 days of data recorded on Zio monitor. Patient had a min HR of 46 bpm, max HR of 132 bpm, and avg HR of 92 bpm. Predominant underlying rhythm was Sinus Rhythm. No VT, SVT, atrial fibrillation, high degree block, or pauses noted. Isolated atrial and ventricular ectopy was rare (<1%). There were no triggered events. No significant arrhythmias detected.  ASSESSMENT:    1. Essential hypertension   2. DOE (dyspnea on exertion)   3. Hyperlipidemia, unspecified hyperlipidemia type   4. Lower leg edema   5. Tobacco use   6. Alcohol use    PLAN:    Hypertension: On atenolol 50 mg daily and amlodipine 10 mg daily.  Appears  controlled  Tachycardia: Zio patch showed no significant arrhythmias  Dyspnea on exertion/ lower extremity edema: TTE shows no structural heart disease.  Will check CMP, BNP, TSH.  Amlodipine use could be contributing to his edema, though edema preceded amlodipine  Hyperlipidemia: LDL 188 on 01/25/2019.  He is close to meeting indication for statin therapy.  He states that he would like to work on lifestyle changes including diet and exercise.  We will recheck lipid panel  Tobacco use: Patient counseled on risks of tobacco use and cessation strongly encouraged  Alcohol use.  Previously was drinking over a pint of whiskey daily, now drinking a fifth of whiskey per week.  Counseled on risks of alcohol use and recommended cessation.  Daytime somnolence: Check sleep study as above  RTC in 6 months  Medication Adjustments/Labs and Tests Ordered: Current medicines are reviewed at length with the patient today.  Concerns regarding medicines are outlined above.  Orders Placed This Encounter  Procedures  . Comprehensive metabolic panel  . Lipid panel  . TSH  . Brain natriuretic peptide  . EKG 12-Lead   No orders of the defined types were placed in this encounter.   Patient Instructions  Medication Instructions:  Your physician recommends that you continue on your current medications as directed. Please refer to the Current Medication list given to you today.  *If you need a refill on your cardiac medications before your next appointment, please call your pharmacy*   Lab Work: CMET, Lipid, BNP, TSH  If you have labs (blood work) drawn today and your tests are completely normal, you will receive your results only by: Marland Kitchen. MyChart Message (if you have MyChart) OR . A paper copy in the mail If you have any lab test that is abnormal or we need to change your treatment, we will call you to review the results.   Testing/Procedures: We will follow up on sleep study previously  ordered  Follow-Up: At [facility], you  and your health needs are our priority.  As part of our continuing mission to provide you with exceptional heart care, we have created designated Provider Care Teams.  These Care Teams include your primary Cardiologist (physician) and Advanced Practice Providers (APPs -  Physician Assistants and Nurse Practitioners) who all work together to provide you with the care you need, when you need it.  We recommend signing up for the patient portal called "MyChart".  Sign up information is provided on this After Visit Summary.  MyChart is used to connect with patients for Virtual Visits (Telemedicine).  Patients are able to view lab/test results, encounter notes, upcoming appointments, etc.  Non-urgent messages can be sent to your provider as well.   To learn more about what you can do with MyChart, go to [email or web address].    Your next appointment:   6 month(s)  The format for your next appointment:   In Person  Provider:   Epifanio Lesches, MD      Signed, Little Ishikawa, MD  02/18/2020 9:33 PM    [facility]

## 2020-02-18 ENCOUNTER — Ambulatory Visit (INDEPENDENT_AMBULATORY_CARE_PROVIDER_SITE_OTHER): Payer: 59 | Admitting: Cardiology

## 2020-02-18 ENCOUNTER — Encounter: Payer: Self-pay | Admitting: Cardiology

## 2020-02-18 VITALS — BP 130/88 | HR 76 | Ht 70.0 in | Wt 278.4 lb

## 2020-02-18 DIAGNOSIS — Z7289 Other problems related to lifestyle: Secondary | ICD-10-CM

## 2020-02-18 DIAGNOSIS — Z789 Other specified health status: Secondary | ICD-10-CM

## 2020-02-18 DIAGNOSIS — I10 Essential (primary) hypertension: Secondary | ICD-10-CM

## 2020-02-18 DIAGNOSIS — Z720 Tobacco use: Secondary | ICD-10-CM

## 2020-02-18 DIAGNOSIS — R600 Localized edema: Secondary | ICD-10-CM | POA: Diagnosis not present

## 2020-02-18 DIAGNOSIS — R0600 Dyspnea, unspecified: Secondary | ICD-10-CM

## 2020-02-18 DIAGNOSIS — E785 Hyperlipidemia, unspecified: Secondary | ICD-10-CM | POA: Diagnosis not present

## 2020-02-18 DIAGNOSIS — R0609 Other forms of dyspnea: Secondary | ICD-10-CM

## 2020-02-18 NOTE — Patient Instructions (Signed)
Medication Instructions:  Your physician recommends that you continue on your current medications as directed. Please refer to the Current Medication list given to you today.  *If you need a refill on your cardiac medications before your next appointment, please call your pharmacy*   Lab Work: CMET, Lipid, BNP, TSH  If you have labs (blood work) drawn today and your tests are completely normal, you will receive your results only by: Marland Kitchen MyChart Message (if you have MyChart) OR . A paper copy in the mail If you have any lab test that is abnormal or we need to change your treatment, we will call you to review the results.   Testing/Procedures: We will follow up on sleep study previously ordered  Follow-Up: At [facility], you and your health needs are our priority.  As part of our continuing mission to provide you with exceptional heart care, we have created designated Provider Care Teams.  These Care Teams include your primary Cardiologist (physician) and Advanced Practice Providers (APPs -  Physician Assistants and Nurse Practitioners) who all work together to provide you with the care you need, when you need it.  We recommend signing up for the patient portal called "MyChart".  Sign up information is provided on this After Visit Summary.  MyChart is used to connect with patients for Virtual Visits (Telemedicine).  Patients are able to view lab/test results, encounter notes, upcoming appointments, etc.  Non-urgent messages can be sent to your provider as well.   To learn more about what you can do with MyChart, go to [email or web address].    Your next appointment:   6 month(s)  The format for your next appointment:   In Person  Provider:   Epifanio Lesches, MD

## 2020-02-19 ENCOUNTER — Telehealth: Payer: Self-pay | Admitting: Cardiology

## 2020-02-19 LAB — COMPREHENSIVE METABOLIC PANEL
ALT: 24 IU/L (ref 0–44)
AST: 13 IU/L (ref 0–40)
Albumin/Globulin Ratio: 1.5 (ref 1.2–2.2)
Albumin: 4 g/dL (ref 4.0–5.0)
Alkaline Phosphatase: 113 IU/L (ref 44–121)
BUN/Creatinine Ratio: 18 (ref 9–20)
BUN: 14 mg/dL (ref 6–20)
Bilirubin Total: 0.3 mg/dL (ref 0.0–1.2)
CO2: 20 mmol/L (ref 20–29)
Calcium: 9.4 mg/dL (ref 8.7–10.2)
Chloride: 106 mmol/L (ref 96–106)
Creatinine, Ser: 0.76 mg/dL (ref 0.76–1.27)
GFR calc Af Amer: 136 mL/min/{1.73_m2} (ref >59)
GFR calc non Af Amer: 117 mL/min/{1.73_m2} (ref >59)
Globulin, Total: 2.6 g/dL (ref 1.5–4.5)
Glucose: 96 mg/dL (ref 65–99)
Potassium: 4.3 mmol/L (ref 3.5–5.2)
Sodium: 142 mmol/L (ref 134–144)
Total Protein: 6.6 g/dL (ref 6.0–8.5)

## 2020-02-19 LAB — LIPID PANEL
Chol/HDL Ratio: 4.9 ratio (ref 0.0–5.0)
Cholesterol, Total: 230 mg/dL — ABNORMAL HIGH (ref 100–199)
HDL: 47 mg/dL (ref >39)
LDL Chol Calc (NIH): 153 mg/dL — ABNORMAL HIGH (ref 0–99)
Triglycerides: 163 mg/dL — ABNORMAL HIGH (ref 0–149)
VLDL Cholesterol Cal: 30 mg/dL (ref 5–40)

## 2020-02-19 LAB — BRAIN NATRIURETIC PEPTIDE: BNP: 35 pg/mL (ref 0.0–100.0)

## 2020-02-19 LAB — TSH: TSH: 2.03 u[IU]/mL (ref 0.450–4.500)

## 2020-02-19 NOTE — Telephone Encounter (Signed)
PA #[id number]

## 2020-02-19 NOTE — Telephone Encounter (Signed)
PA is pending with [facility] for split night

## 2020-02-25 NOTE — Telephone Encounter (Signed)
[employer] deemed split night medically unnecessary.  Can do peer to peer - call [phone] or do you want to try HST?

## 2020-02-27 ENCOUNTER — Encounter: Payer: Self-pay | Admitting: *Deleted

## 2020-02-27 ENCOUNTER — Other Ambulatory Visit: Payer: Self-pay | Admitting: *Deleted

## 2020-02-27 DIAGNOSIS — R400 Somnolence: Secondary | ICD-10-CM

## 2020-02-27 DIAGNOSIS — I10 Essential (primary) hypertension: Secondary | ICD-10-CM

## 2020-03-05 NOTE — Telephone Encounter (Signed)
No PA needed.  Called patient and lm that he is scheduled for Monday, January 3 at 1 pm, about the info packet and left the sleep lab number.

## 2020-03-08 ENCOUNTER — Other Ambulatory Visit: Payer: Self-pay | Admitting: Cardiology

## 2020-03-10 NOTE — Telephone Encounter (Signed)
This is Dr. Schumann's pt 

## 2020-03-13 ENCOUNTER — Other Ambulatory Visit: Payer: Self-pay

## 2020-03-13 MED ORDER — ATENOLOL 50 MG PO TABS
50.0000 mg | ORAL_TABLET | Freq: Every day | ORAL | 10 refills | Status: DC
Start: 2020-03-13 — End: 2020-08-14

## 2020-03-31 ENCOUNTER — Encounter (HOSPITAL_BASED_OUTPATIENT_CLINIC_OR_DEPARTMENT_OTHER): Payer: 59 | Admitting: Cardiovascular Disease

## 2020-08-14 ENCOUNTER — Encounter: Payer: Self-pay | Admitting: Cardiology

## 2020-08-14 ENCOUNTER — Other Ambulatory Visit: Payer: Self-pay

## 2020-08-14 ENCOUNTER — Ambulatory Visit: Payer: 59 | Admitting: Cardiology

## 2020-08-14 VITALS — BP 137/81 | HR 92 | Ht 70.0 in | Wt 274.2 lb

## 2020-08-14 DIAGNOSIS — R079 Chest pain, unspecified: Secondary | ICD-10-CM

## 2020-08-14 DIAGNOSIS — R0600 Dyspnea, unspecified: Secondary | ICD-10-CM | POA: Diagnosis not present

## 2020-08-14 DIAGNOSIS — Z789 Other specified health status: Secondary | ICD-10-CM

## 2020-08-14 DIAGNOSIS — I10 Essential (primary) hypertension: Secondary | ICD-10-CM | POA: Diagnosis not present

## 2020-08-14 DIAGNOSIS — Z720 Tobacco use: Secondary | ICD-10-CM

## 2020-08-14 DIAGNOSIS — Z7289 Other problems related to lifestyle: Secondary | ICD-10-CM

## 2020-08-14 DIAGNOSIS — R0609 Other forms of dyspnea: Secondary | ICD-10-CM

## 2020-08-14 DIAGNOSIS — E785 Hyperlipidemia, unspecified: Secondary | ICD-10-CM

## 2020-08-14 DIAGNOSIS — R400 Somnolence: Secondary | ICD-10-CM

## 2020-08-14 MED ORDER — ATENOLOL 50 MG PO TABS
50.0000 mg | ORAL_TABLET | Freq: Every day | ORAL | 3 refills | Status: DC
Start: 2020-08-14 — End: 2021-09-08

## 2020-08-14 MED ORDER — AMLODIPINE BESYLATE 10 MG PO TABS: 10.0000 mg | ORAL_TABLET | Freq: Every day | ORAL | 3 refills | Status: DC

## 2020-08-14 NOTE — Progress Notes (Signed)
Cardiology Office Note:    Date:  08/14/2020   ID:  Sean HartiganJonathan D Page, DOB [birth year], MRN [id number]  PCP:  Juluis RainierBarnes, Elizabeth, MD  Cardiologist:  None  Electrophysiologist:  None   Referring MD: Juluis RainierBarnes, Elizabeth, MD   Chief Complaint  Patient presents with  . Hypertension    History of Present Illness:    Sean Page is a 35 y.o. male with a hx of migraines, hypertension, tobacco use, alcohol use presents for a follow-up visit.  He was initially seen on 02/01/2019 for an evaluation of tachycardia.  He denies any palpitations or chest pain.  Does report some dyspnea on exertion.  Has also noted lower extremity edema.  He [facility].  Since that time reports he has not been working and has been smoking and drinking more.  He was smoking 1.5 to 2 packs/day.  Has smoked for 20 years.  States that he was drinking whiskey, drinks over a pint per day.  Started on atenolol 25 mg daily a week prior for hypertension.    At initial clinic visit, atenolol was increased to 50 mg daily.  TTE was ordered given his dyspnea on exertion, which showed no significant abnormalities.  Given his tachycardia, a Zio patch x3 days was ordered, which showed no significant arrhythmias.  Today, he is doing well. He states that since his last visit he has been working a lot because he recently got a promotion. He states that at work he walks up six flights of stairs. He states that he sometimes experiences chest discomfort that began after the back surgery he underwent three years ago. He reports that once it begins it lasts for a few seconds and then resolves with stretching. The tightness does not occur with exertion. He reports that he is not currently checking his blood pressure at home. He states that he has began eating more of the "green" foods and began cutting out junk foods but continues to eat steak. He also states that he began walking at home.  He smokes 1 pack and a half of cigarettes per day. He denies any chest pain, palpitations, SOB, lightheadedness, or syncope.      Past Medical History:  Diagnosis Date  . Glaucoma   . Hypertension   . Migraine     No past surgical history on file.  Current Medications: Current Meds  Medication Sig  . ibuprofen (ADVIL,MOTRIN) 800 MG tablet Take 1 tablet (800 mg total) by mouth every 8 (eight) hours as needed.  . meloxicam (MOBIC) 15 MG tablet Take 1 tablet (15 mg total) by mouth daily. No ibuprofen  . methocarbamol (ROBAXIN) 500 MG tablet Take 1 tablet (500 mg total) by mouth 2 (two) times daily. Stop using flexeril  . prednisoLONE acetate (PRED FORTE) 1 % ophthalmic suspension Place 1 drop into the left eye 4 (four) times daily.  . timolol (TIMOPTIC) 0.5 % ophthalmic solution Place 1 drop into the left eye 2 (two) times daily.  . [DISCONTINUED] atenolol (TENORMIN) 50 MG tablet Take 1 tablet (50 mg total) by mouth daily.  . [DISCONTINUED] lidocaine (LIDODERM) 5 % Place 1 patch onto the skin daily. Remove & Discard patch within 12 hours or as directed by MD     Allergies:   Patient has no known allergies.   Social History   Socioeconomic History  . Marital status: Single    Spouse name: Not on file  .  Number of children: Not on file  . Years of education: Not on file  . Highest education level: Not on file  Occupational History  . Not on file  Tobacco Use  . Smoking status: Current Every Day Smoker  . Smokeless tobacco: Never Used  Substance and Sexual Activity  . Alcohol use: No  . Drug use: No  . Sexual activity: Not on file  Other Topics Concern  . Not on file  Social History Narrative  . Not on file   Social Determinants of Health   Financial Resource Strain: Not on file  Food Insecurity: Not on file  Transportation Needs: Not on file  Physical Activity: Not on file  Stress: Not on file  Social Connections: Not on file     Family History: Father had  stroke  ROS:   Please see the history of present illness.    All other systems reviewed and are negative.  EKGs/Labs/Other Studies Reviewed:    The following studies were reviewed today:   EKG:   08/14/2020- The EKG ordered today demonstrates Sinus rhythm, Rate 92  02/18/2020- The ekg ordered demonstrates sinus rhythm, rate 76  Recent Labs: 02/18/2020: ALT 24; BNP 35.0; BUN 14; Creatinine, Ser 0.76; Potassium 4.3; Sodium 142; TSH 2.030  Recent Lipid Panel    Component Value Date/Time   CHOL 230 (H) 02/18/2020 1122   TRIG 163 (H) 02/18/2020 1122   HDL 47 02/18/2020 1122   CHOLHDL 4.9 02/18/2020 1122   LDLCALC 153 (H) 02/18/2020 1122    Physical Exam:    VS:  BP 137/81   Pulse 92   Ht 5\' 10"  (1.778 m)   Wt 274 lb 3.2 oz (124.4 kg)   SpO2 95%   BMI 39.34 kg/m     Wt Readings from Last 3 Encounters:  08/14/20 274 lb 3.2 oz (124.4 kg)  02/18/20 278 lb 6.4 oz (126.3 kg)  08/17/19 270 lb (122.5 kg)     GEN:  Well nourished, well developed in no acute distress HEENT: Normal NECK: No JVD; No carotid bruits LYMPHATICS: No lymphadenopathy CARDIAC: RRR, no murmurs, rubs, gallops RESPIRATORY:  Clear to auscultation without rales, wheezing or rhonchi  ABDOMEN: Soft, non-tender, non-distended MUSCULOSKELETAL:  1+ LE edema; No deformity  SKIN: Warm and dry NEUROLOGIC:  Alert and oriented x 3 PSYCHIATRIC:  Normal affect   TTE 03/13/19: 1. Left ventricular ejection fraction, by visual estimation, is 55 to  60%. The left ventricle has normal function. There is no left ventricular  hypertrophy.  2. The left ventricle has no regional wall motion abnormalities.  3. Global right ventricle has normal systolic function.The right  ventricular size is normal. No increase in right ventricular wall  thickness.  4. Left atrial size was normal.  5. Right atrial size was normal.  6. Presence of pericardial fat pad.  7. Trivial pericardial effusion is present.  8. The mitral  valve is grossly normal. Trivial mitral valve  regurgitation.  9. The tricuspid valve is grossly normal. Tricuspid valve regurgitation  is trivial.  10. The aortic valve is tricuspid. Aortic valve regurgitation is not  visualized. No evidence of aortic valve sclerosis or stenosis.  11. The pulmonic valve was grossly normal. Pulmonic valve regurgitation is  not visualized.  12. Normal pulmonary artery systolic pressure.  13. The tricuspid regurgitant velocity is 2.46 m/s, and with an assumed  right atrial pressure of 3 mmHg, the estimated right ventricular systolic  pressure is normal at 27.2 mmHg.  14. The inferior vena cava is normal in size with greater than 50%  respiratory variability, suggesting right atrial pressure of 3 mmHg.  15. No prior Echocardiogram.  16. The interatrial septum appears to be lipomatous.   Cardiac monitor 03/11/19:  No significant arrhythmias   3 days of data recorded on Zio monitor. Patient had a min HR of 46 bpm, max HR of 132 bpm, and avg HR of 92 bpm. Predominant underlying rhythm was Sinus Rhythm. No VT, SVT, atrial fibrillation, high degree block, or pauses noted. Isolated atrial and ventricular ectopy was rare (<1%). There were no triggered events. No significant arrhythmias detected.  ASSESSMENT:    1. Essential hypertension   2. DOE (dyspnea on exertion)   3. Hyperlipidemia, unspecified hyperlipidemia type   4. Tobacco use   5. Alcohol use   6. Daytime somnolence   7. Chest pain of uncertain etiology    PLAN:    Hypertension: On atenolol 50 mg daily and amlodipine 10 mg daily.  Appears controlled  Tachycardia: Zio patch showed no significant arrhythmias  Chest pain: Description suggest noncardiac chest pain, likely musculoskeletal, as describes nonexertional pain that last for few seconds and improves with stretching  Dyspnea on exertion/ lower extremity edema: TTE shows no structural heart disease.  Normal BNP, TSH, albumin.  Amlodipine  use could be contributing to his edema, though edema preceded amlodipine  Hyperlipidemia: LDL 188 on 01/25/2019.  He is close to meeting indication for statin therapy.  He states that he would like to work on lifestyle changes including diet and exercise.  Lipid panel on 02/18/2020 showed improvement, LDL 153  Tobacco use: Patient counseled on risks of tobacco use and cessation strongly encouraged  Alcohol use.  Previously was drinking over a pint of whiskey daily, now drinking a fifth of whiskey per week.  Counseled on risks of alcohol use and recommended cessation.  Daytime somnolence: Recommend sleep study, but patient wishes to hold off at this time  RTC in 6 months  Medication Adjustments/Labs and Tests Ordered: Current medicines are reviewed at length with the patient today.  Concerns regarding medicines are outlined above.  Orders Placed This Encounter  Procedures  . EKG 12-Lead   Meds ordered this encounter  Medications  . atenolol (TENORMIN) 50 MG tablet    Sig: Take 1 tablet (50 mg total) by mouth daily.    Dispense:  90 tablet    Refill:  3  . amLODipine (NORVASC) 10 MG tablet    Sig: Take 1 tablet (10 mg total) by mouth daily.    Dispense:  90 tablet    Refill:  3    Dose increase    Patient Instructions  Medication Instructions:  Your physician recommends that you continue on your current medications as directed. Please refer to the Current Medication list given to you today.  *If you need a refill on your cardiac medications before your next appointment, please call your pharmacy*  Follow-Up: At [facility], you and your health needs are our priority.  As part of our continuing mission to provide you with exceptional heart care, we have created designated Provider Care Teams.  These Care Teams include your primary Cardiologist (physician) and Advanced Practice Providers (APPs -  Physician Assistants and Nurse Practitioners) who all work together to provide you  with the care you need, when you need it.  We recommend signing up for the patient portal called "MyChart".  Sign up information is provided on this After Visit  Summary.  MyChart is used to connect with patients for Virtual Visits (Telemedicine).  Patients are able to view lab/test results, encounter notes, upcoming appointments, etc.  Non-urgent messages can be sent to your provider as well.   To learn more about what you can do with MyChart, go to [email or web address].    Your next appointment:   12 month(s)  The format for your next appointment:   In Person  Provider:   Epifanio Lesches, MD       Caprice Beaver as a scribe for Little Ishikawa, MD.,have documented all relevant documentation on the behalf of Little Ishikawa, MD,as directed by  Little Ishikawa, MD while in the presence of Little Ishikawa, MD.  I, Little Ishikawa, MD, have reviewed all documentation for this visit. The documentation on 08/14/20 for the exam, diagnosis, procedures, and orders are all accurate and complete.    Signed, Little Ishikawa, MD  08/14/2020 5:43 PM    [facility]

## 2020-08-14 NOTE — Patient Instructions (Signed)

## 2020-10-17 ENCOUNTER — Other Ambulatory Visit: Payer: Self-pay

## 2020-10-17 ENCOUNTER — Ambulatory Visit
Admission: RE | Admit: 2020-10-17 | Discharge: 2020-10-17 | Disposition: A | Discharge status: Home / Self Care | Payer: 59 | Source: Ambulatory Visit | Attending: Home Modifications | Admitting: Home Modifications

## 2020-10-17 ENCOUNTER — Other Ambulatory Visit: Payer: Self-pay | Admitting: Home Modifications

## 2020-10-17 DIAGNOSIS — R059 Cough, unspecified: Secondary | ICD-10-CM

## 2020-10-17 DIAGNOSIS — R079 Chest pain, unspecified: Secondary | ICD-10-CM

## 2020-10-20 ENCOUNTER — Telehealth: Payer: Self-pay | Admitting: Cardiology

## 2020-10-20 NOTE — Telephone Encounter (Signed)
  Pt c/o of Chest Pain: STAT if CP now or developed within 24 hours  1. Are you having CP right now? Not right now  2. Are you experiencing any other symptoms (ex. SOB, nausea, vomiting, sweating)? Sweating, sob during the episode  3. How long have you been experiencing CP? About 2 weeks ago  4. Is your CP continuous or coming and going? Come s and goes. Has it more at night  5. Have you taken Nitroglycerin? no ?

## 2020-10-20 NOTE — Telephone Encounter (Signed)
Spoke with pt. He report for the past 2 weeks he's been experiencing off and on chest pain that he describes as pressure or stabbing pain. He state symptoms usually occur while sitting or laying down, but relieved with walking. He also report occasional sob and sweating during episodes. Pt denies symptoms at the moment.  Nurse offered an appointment with Dr. Bjorn Pippin on 7/27 but pt state he can only come on Fridays. Appointment scheduled with Gillian Shields, NP on 8/19. Pt made aware to report to ER if symptoms reoccur. Pt verbalized understanding.

## 2020-10-21 NOTE — Telephone Encounter (Signed)
Agree with plan 

## 2020-10-22 ENCOUNTER — Ambulatory Visit: Payer: 59 | Admitting: Cardiology

## 2020-11-14 ENCOUNTER — Other Ambulatory Visit: Payer: Self-pay

## 2020-11-14 ENCOUNTER — Ambulatory Visit (HOSPITAL_BASED_OUTPATIENT_CLINIC_OR_DEPARTMENT_OTHER): Payer: 59 | Admitting: Family

## 2020-11-14 ENCOUNTER — Encounter (HOSPITAL_BASED_OUTPATIENT_CLINIC_OR_DEPARTMENT_OTHER): Payer: Self-pay | Admitting: Family

## 2020-11-14 VITALS — BP 118/86 | HR 73 | Ht 70.0 in | Wt 268.0 lb

## 2020-11-14 DIAGNOSIS — R079 Chest pain, unspecified: Secondary | ICD-10-CM

## 2020-11-14 DIAGNOSIS — Z720 Tobacco use: Secondary | ICD-10-CM | POA: Diagnosis not present

## 2020-11-14 DIAGNOSIS — R0600 Dyspnea, unspecified: Secondary | ICD-10-CM | POA: Diagnosis not present

## 2020-11-14 DIAGNOSIS — I10 Essential (primary) hypertension: Secondary | ICD-10-CM | POA: Diagnosis not present

## 2020-11-14 DIAGNOSIS — R0609 Other forms of dyspnea: Secondary | ICD-10-CM

## 2020-11-14 NOTE — Patient Instructions (Addendum)
Medication Instructions:  Continue your current medications.   On the day of your cardiac CTA please take 1.5 tablets (75mg ) of your Atenolol. This will help to get a better picture of your heart. The next day, return to 1 tablet (50mg ) daily.  *If you need a refill on your cardiac medications before your next appointment, please call your pharmacy*   Lab Work: Your physician recommends that you return for lab work 3-5 days prior to cardiac CTA at [place] [facility].   If you have labs (blood work) drawn today and your tests are completely normal, you will receive your results only by: MyChart Message (if you have MyChart) OR A paper copy in the mail If you have any lab test that is abnormal or we need to change your treatment, we will call you to review the results.   Testing/Procedures: Your physician has requested that you have cardiac CT. Cardiac computed tomography (CT) is a painless test that uses an x-ray machine to take clear, detailed pictures of your heart. Please follow instruction sheet as given.   Your physician has requested that you have an echocardiogram. Echocardiography is a painless test that uses sound waves to create images of your heart. It provides your doctor with information about the size and shape of your heart and how well your heart's chambers and valves are working. This procedure takes approximately one hour. There are no restrictions for this procedure.   Follow-Up: At [facility], you and your health needs are our priority.  As part of our continuing mission to provide you with exceptional heart care, we have created designated Provider Care Teams.  These Care Teams include your primary Cardiologist (physician) and Advanced Practice Providers (APPs -  Physician Assistants and Nurse Practitioners) who all work together to provide you with the care you need, when you need it.  We recommend signing up for the patient portal called "MyChart".  Sign up  information is provided on this After Visit Summary.  MyChart is used to connect with patients for Virtual Visits (Telemedicine).  Patients are able to view lab/test results, encounter notes, upcoming appointments, etc.  Non-urgent messages can be sent to your provider as well.   To learn more about what you can do with MyChart, go to [facility].    Your next appointment:   4-6 weeks   The format for your next appointment:   In Person  Provider:   You may see [facility], MD or one of the following Advanced Practice Providers on your designated Care Team:   [email or web address], PA-C Little Ishikawa, PA-C Theodore Demark, DNP, ANP   Other Instructions    Your cardiac CT will be scheduled at one of the below locations:   [facility] [address] [place], [address] [place] [phone]  If scheduled at [facility], please arrive at the [facility] main entrance (entrance A) of [facility] 30 minutes prior to test start time. Proceed to the [facility] Radiology Department (first floor) to check-in and test prep.  Please follow these instructions carefully (unless otherwise directed):  Hold all erectile dysfunction medications at least 3 days (72 hrs) prior to test.  On the Night Before the Test: Be sure to Drink plenty of water. Do not consume any caffeinated/decaffeinated beverages or chocolate 12 hours prior to your test. Do not take any antihistamines 12 hours prior to your test.  On the Day of the Test: Drink plenty  of water until 1 hour prior to the test. Do not eat any food 4 hours prior to the test. You may take your regular medications prior to the test.  Take Atenolol 1.5 tablets (75mg ) two hours prior to test.      After the Test: Drink plenty of water. After receiving IV contrast, you may experience a mild flushed feeling. This is normal. On occasion, you may experience a mild rash up to 24 hours after the  test. This is not dangerous. If this occurs, you can take Benadryl 25 mg and increase your fluid intake. If you experience trouble breathing, this can be serious. If it is severe call 911 IMMEDIATELY. If it is mild, please call our office. If you take any of these medications: Glipizide/Metformin, Avandament, Glucavance, please do not take 48 hours after completing test unless otherwise instructed.  Please allow 2-4 weeks for scheduling of routine cardiac CTs. Some insurance companies require a pre-authorization which may delay scheduling of this test.   For non-scheduling related questions, please contact the cardiac imaging nurse navigator should you have any questions/concerns: , Cardiac Imaging Nurse Navigator [place], Cardiac Imaging Nurse Navigator [facility] Direct Office Dial: [phone]   For scheduling needs, including cancellations and rescheduling, please call [phone], [phone].

## 2020-11-14 NOTE — Progress Notes (Signed)
Office Visit    Patient Name: Sean Page Date of Encounter: 11/14/2020  PCP:  Dennie Maizes, NP   [facility]  Cardiologist:  Little Ishikawa, MD  Advanced Practice Provider:  No care team member to display Electrophysiologist:  None      Chief Complaint    Sean Page is a 35 y.o. male with a hx of migraines, hypertension, tobacco use, alcohol use  presents today for chest pain    Past Medical History    Past Medical History:  Diagnosis Date   Glaucoma    Hypertension    Migraine    No past surgical history on file.  Allergies  No Known Allergies  History of Present Illness    Sean Page is a 35 y.o. male with a hx of migraines, hypertension, tobacco use, alcohol use, corneal transplant at [facility] last seen 08/14/20 by Dr. Bjorn Pippin.  He was initially seen 01/2019 for tachycardia. His Atenolol was increased to 50mg  daily. TTE was ordered given dyspnea on exertion with no significant abnormalities. ZIO patch worn for 3 days with no significant arrhythmias.   He was last seen 08/14/20 by Dr. 08/16/20. Noted working a lot because of promotion. Noted chest pain which began after back surgery 3 years prior which was fleeting and self resolved. As it was nonexertional likely musculoskeletal, ischemic workup as deferred.  He presents today for chest pain. Reports on and off chest pain which initiated 2 weeks ago. Initial episode woke up a Saturday with shortness of breath and gasping for air and felt as if someone was hammering iato his left chest. Felt lack of energy, cough, and never got his energy back. Called primary care who started an inhaler with some improvement in breathing. Still with tightness in his left chest that radiates to his shoulder blade. Happens at rest and with activity. 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 There is some tenderness on palpation but  he has not tried Tylenol or Ibuprofen.Still drinking  multiple nights per week with liquor reserved to the weekend. Tells me he will "drink a fifth and wake up feeling fine".   Labs via PCP 10/16/20: ALT 28, AST 21, creatinine 1.02, Hb 15.6, K 4.6  EKGs/Labs/Other Studies Reviewed:   The following studies were reviewed today:  EKG:  EKG is ordered today.  The ekg ordered today demonstrates NSR 73 bpm   Recent Labs: 02/18/2020: ALT 24; BNP 35.0; BUN 14; Creatinine, Ser 0.76; Potassium 4.3; Sodium 142; TSH 2.030  Recent Lipid Panel    Component Value Date/Time   CHOL 230 (H) 02/18/2020 1122   TRIG 163 (H) 02/18/2020 1122   HDL 47 02/18/2020 1122   CHOLHDL 4.9 02/18/2020 1122   LDLCALC 153 (H) 02/18/2020 1122    Home Medications   Current Meds  Medication Sig   albuterol (VENTOLIN HFA) 108 (90 Base) MCG/ACT inhaler as needed.   amLODipine (NORVASC) 10 MG tablet Take 1 tablet (10 mg total) by mouth daily.   atenolol (TENORMIN) 50 MG tablet Take 1 tablet (50 mg total) by mouth daily.   benzonatate (TESSALON) 100 MG capsule Take 100 mg by mouth as needed.   ibuprofen (ADVIL,MOTRIN) 800 MG tablet Take 1 tablet (800 mg total) by mouth every 8 (eight) hours as needed.   meloxicam (MOBIC) 15 MG tablet Take 1 tablet (15 mg total) by mouth daily. No ibuprofen   methocarbamol (ROBAXIN)  500 MG tablet Take 1 tablet (500 mg total) by mouth 2 (two) times daily. Stop using flexeril   prednisoLONE acetate (PRED FORTE) 1 % ophthalmic suspension Place 1 drop into the left eye 4 (four) times daily.   timolol (TIMOPTIC) 0.5 % ophthalmic solution Place 1 drop into the left eye 2 (two) times daily.     Review of Systems      All other systems reviewed and are otherwise negative except as noted above.  Physical Exam    VS:  BP 118/86   Pulse 73   Ht 5\' 10"  (1.778 m)   Wt 268 lb (121.6 kg)   SpO2 97%   BMI 38.45 kg/m  , BMI Body mass index is 38.45 kg/m.  Wt Readings from Last 3 Encounters:   11/14/20 268 lb (121.6 kg)  08/14/20 274 lb 3.2 oz (124.4 kg)  02/18/20 278 lb 6.4 oz (126.3 kg)     GEN: Well nourished, overweight, well developed, in no acute distress. HEENT: normal. Neck: Supple, no JVD, carotid bruits, or masses. Cardiac: RRR, no murmurs, rubs, or gallops. No clubbing, cyanosis, edema.  Radials/PT 2+ and equal bilaterally.  Respiratory:  Respirations regular and unlabored, clear to auscultation bilaterally. GI: Soft, nontender, nondistended. MS: No deformity or atrophy. Skin: Warm and dry, no rash. Neuro:  Strength and sensation are intact. Psych: Normal affect.  Assessment & Plan    Chest pain - EKG with no acute ST/T wave changes. Chest tightness at rest and with activity. Risk factors include tobacco use, hypertension, obesity. Recommend cardiac CTA. If coronary disease noted will need to initiate statin.   Alcohol and tobacco use - Alcohol encouraged. Smoking cessation encouraged. Recommend utilization of [id number].   Dyspnea - Likely etiology deconditioning. To rule out HF as contributory we will plan for echocardiogram.   HTN - BP well controlled. Continue current antihypertensive regimen.    Tachycardia - No recurrence. Continue current dose Atenolol  Disposition: Follow up in 4-6 week(s) with Dr. 02/20/20 or APP.  Signed, Bjorn Pippin, NP 11/14/2020, 10:38 PM [facility]

## 2020-11-24 ENCOUNTER — Emergency Department (HOSPITAL_COMMUNITY)
Admission: EM | Admit: 2020-11-24 | Discharge: 2020-11-24 | Disposition: A | Discharge status: Home / Self Care | Payer: No Typology Code available for payment source | Attending: Emergency Medicine | Admitting: Emergency Medicine

## 2020-11-24 ENCOUNTER — Other Ambulatory Visit: Payer: Self-pay

## 2020-11-24 ENCOUNTER — Encounter (HOSPITAL_COMMUNITY): Payer: Self-pay

## 2020-11-24 DIAGNOSIS — Z79899 Other long term (current) drug therapy: Secondary | ICD-10-CM | POA: Diagnosis not present

## 2020-11-24 DIAGNOSIS — T63481A Toxic effect of venom of other arthropod, accidental (unintentional), initial encounter: Secondary | ICD-10-CM | POA: Diagnosis not present

## 2020-11-24 DIAGNOSIS — F17200 Nicotine dependence, unspecified, uncomplicated: Secondary | ICD-10-CM | POA: Insufficient documentation

## 2020-11-24 DIAGNOSIS — T7840XA Allergy, unspecified, initial encounter: Secondary | ICD-10-CM

## 2020-11-24 DIAGNOSIS — I10 Essential (primary) hypertension: Secondary | ICD-10-CM | POA: Insufficient documentation

## 2020-11-24 MED ORDER — EPINEPHRINE 0.3 MG/0.3ML IJ SOAJ: 0.3000 mg | Freq: Once | INTRAMUSCULAR | 2 refills | Status: AC

## 2020-11-24 MED ORDER — FAMOTIDINE 20 MG PO TABS
40.0000 mg | ORAL_TABLET | Freq: Once | ORAL | Status: AC
  Administered 2020-11-24: 40 mg via ORAL
  Filled 2020-11-24: qty 2

## 2020-11-24 NOTE — ED Provider Notes (Signed)
[facility]-EMERGENCY DEPT Provider Note   CSN: [id number] Arrival date & time: 11/24/20  1119     History Chief Complaint  Patient presents with   Allergic Reaction    Sean Page is a 35 y.o. male.  35 year old male presents with allergic reaction to insect sting.  States he was stung on his neck prior to arrival.  States he developed hives and pruritus.  Also noted trouble breathing.  Patient treated with Benadryl and epi and Solu-Medrol.  States he feels much better at this time.  No prior history of allergic reactions.      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 No pertinent surgical history.     Family History  Problem Relation Age of Onset   Hypertension Mother    Hypertension Father    Stroke Father     Social History   Tobacco Use   Smoking status: Every Day   Smokeless tobacco: Never  Substance Use Topics   Alcohol use: No   Drug use: No    Home Medications Prior to Admission medications   Medication Sig Start Date End Date Taking? Authorizing Provider  albuterol (VENTOLIN HFA) 108 (90 Base) MCG/ACT inhaler as needed. 10/07/20   [provider]  amLODipine (NORVASC) 10 MG tablet Take 1 tablet (10 mg total) by mouth daily. 08/14/20 08/09/21  Little Ishikawa, MD  atenolol (TENORMIN) 50 MG tablet Take 1 tablet (50 mg total) by mouth daily. 08/14/20   Little Ishikawa, MD  benzonatate (TESSALON) 100 MG capsule Take 100 mg by mouth as needed. 10/07/20   [provider]  ibuprofen (ADVIL,MOTRIN) 800 MG tablet Take 1 tablet (800 mg total) by mouth every 8 (eight) hours as needed. 07/26/16   Kirtland Bouchard, PA-C  meloxicam (MOBIC) 15 MG tablet Take 1 tablet (15 mg total) by mouth daily. No ibuprofen 03/01/19    Palumbo, April, MD  methocarbamol (ROBAXIN) 500 MG tablet Take 1 tablet (500 mg total) by mouth 2 (two) times daily. Stop using flexeril 03/01/19   Palumbo, April, MD  prednisoLONE acetate (PRED FORTE) 1 % ophthalmic suspension Place 1 drop into the left eye 4 (four) times daily. 03/27/19   [provider]  timolol (TIMOPTIC) 0.5 % ophthalmic solution Place 1 drop into the left eye 2 (two) times daily. 05/23/19   [provider]    Allergies    Patient has no known allergies.  Review of Systems   Review of Systems  All other systems reviewed and are negative.  Physical Exam Updated Vital Signs BP 135/90   Pulse 88   Temp 97.8 F (36.6 C) (Oral)   Resp 20   Ht 1.778 m (5\' 10" )   Wt 121 kg   SpO2 93%   BMI 38.28 kg/m   Physical Exam Vitals and nursing note reviewed.  Constitutional:      General: He is not in acute distress.    Appearance: Normal appearance. He is well-developed. He is not toxic-appearing.  HENT:     Head: Normocephalic and atraumatic.  Eyes:     General: Lids are normal.     Conjunctiva/sclera: Conjunctivae normal.     Pupils: Pupils are equal, round, and reactive to light.  Neck:  Thyroid: No thyroid mass.     Trachea: No tracheal deviation.  Cardiovascular:     Rate and Rhythm: Normal rate and regular rhythm.     Heart sounds: Normal heart sounds. No murmur heard.   No gallop.  Pulmonary:     Effort: Pulmonary effort is normal. No respiratory distress.     Breath sounds: Normal breath sounds. No stridor. No decreased breath sounds, wheezing, rhonchi or rales.  Abdominal:     General: There is no distension.     Palpations: Abdomen is soft.     Tenderness: There is no abdominal tenderness. There is no rebound.  Musculoskeletal:        General: No tenderness. Normal range of motion.     Cervical back: Normal range of motion and neck supple.  Skin:    General: Skin is warm and dry.     Findings: No abrasion or rash.      Comments: Slight erythema noted to the bilateral ears.  Neurological:     Mental Status: He is alert and oriented to person, place, and time. Mental status is at baseline.     GCS: GCS eye subscore is 4. GCS verbal subscore is 5. GCS motor subscore is 6.     Cranial Nerves: Cranial nerves are intact. No cranial nerve deficit.     Sensory: No sensory deficit.     Motor: Motor function is intact.  Psychiatric:        Attention and Perception: Attention normal.        Speech: Speech normal.        Behavior: Behavior normal.    ED Results / Procedures / Treatments   Labs (all labs ordered are listed, but only abnormal results are displayed) Labs Reviewed - No data to display  EKG None  Radiology No results found.  Procedures Procedures   Medications Ordered in ED Medications - No data to display  ED Course  I have reviewed the triage vital signs and the nursing notes.  Pertinent labs & imaging results that were available during my care of the patient were reviewed by me and considered in my medical decision making (see chart for details).    MDM Rules/Calculators/A&P                           Patient received extensive treatment prior to arrival here.  Was monitored here and no recurrence of his symptoms.  Will prescribe patient EpiPen Final Clinical Impression(s) / ED Diagnoses Final diagnoses:  None    Rx / DC Orders ED Discharge Orders     None        Lorre Nick, MD 11/24/20 1500

## 2020-11-24 NOTE — ED Triage Notes (Signed)
Patient outside at work and was stung. On EMS arrival patient had self admin 3 benadryl po with hives swollen eye lids and lip swelling, audible wheezing. Ems admin 125 soul-medrol, 0.3 epi, 5 albuterol. Swelling improved per ems.

## 2020-11-25 ENCOUNTER — Telehealth (HOSPITAL_COMMUNITY): Payer: Self-pay | Admitting: *Deleted

## 2020-11-25 ENCOUNTER — Telehealth (HOSPITAL_COMMUNITY): Payer: Self-pay | Admitting: Emergency Medicine

## 2020-11-25 NOTE — Telephone Encounter (Signed)
Attempted to call patient regarding upcoming cardiac CT appointment. °Left message on voicemail with name and callback number ° °Amel Gianino RN Navigator Cardiac Imaging °[facility] °[phone] Office °[phone] Cell ° °

## 2020-11-25 NOTE — Telephone Encounter (Signed)
Reaching out to patient to offer assistance regarding upcoming cardiac imaging study; pt verbalizes understanding of appt date/time, parking situation and where to check in, pre-test NPO status and medications ordered, and verified current allergies; name and call back number provided for further questions should they arise Rockwell Alexandria RN Navigator Cardiac Imaging [facility] [phone] office [phone] cell  Reminded to get labs Per C. Walker - pt will take 75mg  atenolol 2 hr prior to scan  Denies IV issues Denies claustro

## 2020-11-28 ENCOUNTER — Ambulatory Visit (HOSPITAL_COMMUNITY)
Admission: RE | Admit: 2020-11-28 | Discharge: 2020-11-28 | Disposition: A | Discharge status: Home / Self Care | Payer: 59 | Source: Ambulatory Visit | Attending: Family | Admitting: Family

## 2020-11-28 ENCOUNTER — Other Ambulatory Visit: Payer: Self-pay

## 2020-11-28 DIAGNOSIS — I10 Essential (primary) hypertension: Secondary | ICD-10-CM

## 2020-11-28 DIAGNOSIS — Z006 Encounter for examination for normal comparison and control in clinical research program: Secondary | ICD-10-CM

## 2020-11-28 DIAGNOSIS — R079 Chest pain, unspecified: Secondary | ICD-10-CM

## 2020-11-28 LAB — BASIC METABOLIC PANEL WITH GFR
BUN/Creatinine Ratio: 15 (ref 9–20)
BUN: 12 mg/dL (ref 6–20)
CO2: 23 mmol/L (ref 20–29)
Calcium: 9.4 mg/dL (ref 8.7–10.2)
Chloride: 105 mmol/L (ref 96–106)
Creatinine, Ser: 0.81 mg/dL (ref 0.76–1.27)
Glucose: 81 mg/dL (ref 65–99)
Potassium: 5.1 mmol/L (ref 3.5–5.2)
Sodium: 143 mmol/L (ref 134–144)
eGFR: 116 mL/min/1.73

## 2020-11-28 MED ORDER — IOHEXOL 350 MG/ML SOLN
175.0000 mL | Freq: Once | INTRAVENOUS | Status: AC | PRN
  Administered 2020-11-28: 175 mL via INTRAVENOUS

## 2020-11-28 MED ORDER — NITROGLYCERIN 0.4 MG SL SUBL
0.8000 mg | SUBLINGUAL_TABLET | Freq: Once | SUBLINGUAL | Status: AC
  Administered 2020-11-28: 0.8 mg via SUBLINGUAL

## 2020-11-28 MED ORDER — METOPROLOL TARTRATE 5 MG/5ML IV SOLN
5.0000 mg | INTRAVENOUS | Status: DC | PRN
  Administered 2020-11-28: 5 mg via INTRAVENOUS

## 2020-11-28 MED ORDER — NITROGLYCERIN 0.4 MG SL SUBL
SUBLINGUAL_TABLET | SUBLINGUAL | Status: AC
  Filled 2020-11-28: qty 2

## 2020-11-28 MED ORDER — METOPROLOL TARTRATE 5 MG/5ML IV SOLN
INTRAVENOUS | Status: AC
  Administered 2020-11-28: 5 mg via INTRAVENOUS
  Filled 2020-11-28: qty 15

## 2020-11-28 NOTE — Research (Signed)
IDENTIFY Informed Consent                  Subject Name: Sean Page   Subject met inclusion and exclusion criteria.  The informed consent form, study requirements and expectations were reviewed with the subject and questions and concerns were addressed prior to the signing of the consent form.  The subject verbalized understanding of the trial requirements.  The subject agreed to participate in the IDENTIFY trial and signed the informed consent at 1355PM on 11/28/20.  The informed consent was obtained prior to performance of any protocol-specific procedures for the subject.  A copy of the signed informed consent was given to the subject and a copy was placed in the subject's medical record.    Brenton Grills, Research Assistant

## 2020-12-03 ENCOUNTER — Telehealth: Payer: Self-pay

## 2020-12-03 MED ORDER — ROSUVASTATIN CALCIUM 10 MG PO TABS: 10.0000 mg | ORAL_TABLET | Freq: Every day | ORAL | 3 refills | Status: DC

## 2020-12-03 NOTE — Telephone Encounter (Signed)
The patient has been notified of the result and verbalized understanding.  All questions (if any) were answered. Theresia Majors, RN 12/03/2020 5:01 PM

## 2020-12-03 NOTE — Telephone Encounter (Signed)
-----   Message from Alver Sorrow, NP sent at 11/28/2020 10:57 PM EDT ----- Coronary calcium of score with only minimal nonobstructive plaque. Not significant enough to cause chest pain. Recommend addition of Crestor 10mg  daily for secondary prevention.

## 2020-12-05 ENCOUNTER — Other Ambulatory Visit: Payer: Self-pay

## 2020-12-05 ENCOUNTER — Ambulatory Visit (HOSPITAL_COMMUNITY): Payer: 59 | Attending: Family

## 2020-12-05 DIAGNOSIS — I10 Essential (primary) hypertension: Secondary | ICD-10-CM

## 2020-12-05 DIAGNOSIS — R079 Chest pain, unspecified: Secondary | ICD-10-CM

## 2020-12-05 DIAGNOSIS — R0609 Other forms of dyspnea: Secondary | ICD-10-CM

## 2020-12-05 DIAGNOSIS — R0600 Dyspnea, unspecified: Secondary | ICD-10-CM | POA: Diagnosis present

## 2020-12-05 LAB — ECHOCARDIOGRAM COMPLETE
Area-P 1/2: 5.02 cm2
S' Lateral: 3.1 cm

## 2020-12-12 ENCOUNTER — Ambulatory Visit: Payer: 59 | Admitting: Physician Assistant

## 2020-12-18 ENCOUNTER — Encounter (HOSPITAL_BASED_OUTPATIENT_CLINIC_OR_DEPARTMENT_OTHER): Payer: Self-pay | Admitting: Family

## 2020-12-18 ENCOUNTER — Ambulatory Visit (INDEPENDENT_AMBULATORY_CARE_PROVIDER_SITE_OTHER): Payer: 59 | Admitting: Family

## 2020-12-18 ENCOUNTER — Other Ambulatory Visit: Payer: Self-pay

## 2020-12-18 VITALS — BP 112/78 | HR 89 | Ht 70.0 in | Wt 275.4 lb

## 2020-12-18 DIAGNOSIS — R000 Tachycardia, unspecified: Secondary | ICD-10-CM

## 2020-12-18 DIAGNOSIS — I2510 Atherosclerotic heart disease of native coronary artery without angina pectoris: Secondary | ICD-10-CM | POA: Diagnosis not present

## 2020-12-18 DIAGNOSIS — E785 Hyperlipidemia, unspecified: Secondary | ICD-10-CM

## 2020-12-18 MED ORDER — ASPIRIN EC 81 MG PO TBEC: 81.0000 mg | DELAYED_RELEASE_TABLET | Freq: Every day | ORAL | 3 refills | Status: AC

## 2020-12-18 NOTE — Patient Instructions (Addendum)
Medication Instructions:  Your physician has recommended you make the following change in your medication:   START Aspirin EC 81mg  daily  CONTINUE Rosuvastatin (Crestor) 10mg  daily  *this helps lower your cholesterol to help prevent your small amount of plaque in your heart from building up  *If you need a refill on your cardiac medications before your next appointment, please call your pharmacy*  Lab Work: Your physician recommends that you return for lab work in 1 month for fasting lipid panel and CMP  Your provider has recommended lab work. Please have this collected at [facility] [place]. The lab is open 8:00 am - 4:30 pm. Please avoid 12:00p - 1:00p for lunch hour. You do not need an appointment. Please go to [address] [place], [address] [place]. This is in the Primary Care office on the 3rd floor, let them know you are there for blood work and they will direct you to the lab.   If you have labs (blood work) drawn today and your tests are completely normal, you will receive your results only by: MyChart Message (if you have MyChart) OR A paper copy in the mail If you have any lab test that is abnormal or we need to change your treatment, we will call you to review the results.   Testing/Procedures:  Your echocardiogram shows normal heart pumping function and normal heart valves.   Your cardiac CTA showed only very mild plaque build up that was <25% in two of your heart arteries. We have started Aspirin and Rosuvastatin (Crestor) to help prevent this from progressing.   Follow-Up: At [facility], you and your health needs are our priority.  As part of our continuing mission to provide you with exceptional heart care, we have created designated Provider Care Teams.  These Care Teams include your primary Cardiologist (physician) and Advanced Practice Providers (APPs -  Physician Assistants and Nurse Practitioners) who all work together to provide  you with the care you need, when you need it.  We recommend signing up for the patient portal called "MyChart".  Sign up information is provided on this After Visit Summary.  MyChart is used to connect with patients for Virtual Visits (Telemedicine).  Patients are able to view lab/test results, encounter notes, upcoming appointments, etc.  Non-urgent messages can be sent to your provider as well.   To learn more about what you can do with MyChart, go to 07371.    Your next appointment:   6 month(s)  The format for your next appointment:   In Person  Provider:   You may see [facility], MD or one of the following Advanced Practice Providers on your designated Care Team:   [email or web address], PA-C Little Ishikawa, PA-C Theodore Demark, DNP, ANP  Other Instructions  For coronary artery disease often called "heart disease" we aim for optimal guideline directed medical therapy. We use the "A, B, C"s to help keep Juanda Crumble on track!  A = Aspirin 81mg  daily B = Beta blocker which helps to relax the heart. This is your Atenolol. C = Cholesterol control. You take Rosuvastatin to help control your cholesterol.   Heart Healthy Diet Recommendations: A low-salt diet is recommended. Meats should be grilled, baked, or boiled. Avoid fried foods. Focus on lean protein sources like fish or chicken with vegetables and fruits. The American Heart Association is a Joni Reining!   Exercise recommendations: The American Heart Association recommends 150 minutes of moderate intensity exercise weekly.  Try 30 minutes of moderate intensity exercise 4-5 times per week. This could include walking, jogging, or swimming.   Managing the Challenge of Quitting Smoking Quitting smoking is a physical and mental challenge. You will face cravings, withdrawal symptoms, and temptation. Before quitting, work with your health care provider to make a plan that can help you manage quitting. Preparation  can help you quit and keep you from giving in. How to manage lifestyle changes Managing stress Stress can make you want to smoke, and wanting to smoke may cause stress. It is important to find ways to manage your stress. You might try some of the following: Practice relaxation techniques. Breathe slowly and deeply, in through your nose and out through your mouth. Listen to music. Soak in a bath or take a shower. Imagine a peaceful place or vacation. Get some support. Talk with family or friends about your stress. Join a support group. Talk with a counselor or therapist. Get some physical activity. Go for a walk, run, or bike ride. Play a favorite sport. Practice yoga.  Medicines Talk with your health care provider about medicines that might help you deal with cravings and make quitting easier for you. Relationships Social situations can be difficult when you are quitting smoking. To manage this, you can: Avoid parties and other social situations where people might be smoking. Avoid alcohol. Leave right away if you have the urge to smoke. Explain to your family and friends that you are quitting smoking. Ask for support and let them know you might be a bit grumpy. Plan activities where smoking is not an option. General instructions Be aware that many people gain weight after they quit smoking. However, not everyone does. To keep from gaining weight, have a plan in place before you quit and stick to the plan after you quit. Your plan should include: Having healthy snacks. When you have a craving, it may help to: Eat popcorn, carrots, celery, or other cut vegetables. Chew sugar-free gum. Changing how you eat. Eat small portion sizes at meals. Eat 4-6 small meals throughout the day instead of 1-2 large meals a day. Be mindful when you eat. Do not watch television or do other things that might distract you as you eat. Exercising regularly. Make time to exercise each day. If you do not  have time for a long workout, do short bouts of exercise for 5-10 minutes several times a day. Do some form of strengthening exercise, such as weight lifting. Do some exercise that gets your heart beating and causes you to breathe deeply, such as walking fast, running, swimming, or biking. This is very important. Drinking plenty of water or other low-calorie or no-calorie drinks. Drink 6-8 glasses of water daily.  How to recognize withdrawal symptoms Your body and mind may experience discomfort as you try to get used to not having nicotine in your system. These effects are called withdrawal symptoms. They may include: Feeling hungrier than normal. Having trouble concentrating. Feeling irritable or restless. Having trouble sleeping. Feeling depressed. Craving a cigarette. To manage withdrawal symptoms: Avoid places, people, and activities that trigger your cravings. Remember why you want to quit. Get plenty of sleep. Avoid coffee and other caffeinated drinks. These may worsen some of your symptoms. These symptoms may surprise you. But be assured that they are normal to have when quitting smoking. How to manage cravings Come up with a plan for how to deal with your cravings. The plan should include the following: A definition of  the specific situation you want to deal with. An alternative action you will take. A clear idea for how this action will help. The name of someone who might help you with this. Cravings usually last for 5-10 minutes. Consider taking the following actions to help you with your plan to deal with cravings: Keep your mouth busy. Chew sugar-free gum. Suck on hard candies or a straw. Brush your teeth. Keep your hands and body busy. Change to a different activity right away. Squeeze or play with a ball. Do an activity or a hobby, such as making bead jewelry, practicing needlepoint, or working with wood. Mix up your normal routine. Take a short exercise break. Go for  a quick walk or run up and down stairs. Focus on doing something kind or helpful for someone else. Call a friend or family member to talk during a craving. Join a support group. Contact a quitline. Where to find support To get help or find a support group: Call the National Cancer Institute's Smoking Quitline: 1-800-QUIT NOW 850-508-8881) Visit the website of the Substance Abuse and Mental Health Services Administration: SkateOasis.com.pt Text QUIT to SmokefreeTXT: 932671 Where to find more information Visit these websites to find more information on quitting smoking: National Cancer Institute: www.smokefree.gov American Lung Association: www.lung.org American Cancer Society: www.cancer.org Centers for Disease Control and Prevention: FootballExhibition.com.br American Heart Association: www.heart.org Contact a health care provider if: You want to change your plan for quitting. The medicines you are taking are not helping. Your eating feels out of control or you cannot sleep. Get help right away if: You feel depressed or become very anxious. Summary Quitting smoking is a physical and mental challenge. You will face cravings, withdrawal symptoms, and temptation to smoke again. Preparation can help you as you go through these challenges. Try different techniques to manage stress, handle social situations, and prevent weight gain. You can deal with cravings by keeping your mouth busy (such as by chewing gum), keeping your hands and body busy, calling family or friends, or contacting a quitline for people who want to quit smoking. You can deal with withdrawal symptoms by avoiding places where people smoke, getting plenty of rest, and avoiding drinks with caffeine. This information is not intended to replace advice given to you by your health care provider. Make sure you discuss any questions you have with your health care provider. Document Revised: 01/02/2019 Document Reviewed: 01/02/2019 Elsevier Patient  Education  2022 [employer].

## 2020-12-18 NOTE — Progress Notes (Signed)
Office Visit    Patient Name: Sean Page Date of Encounter: 12/18/2020  PCP:  Dennie Maizes, NP   [facility]  Cardiologist:  Little Ishikawa, MD  Advanced Practice Provider:  No care team member to display Electrophysiologist:  None      Chief Complaint    Sean Page is a 35 y.o. male with a hx of migraines, hypertension, tobacco use, alcohol use  presents today for follow up after cardiac testing.   Past Medical History    Past Medical History:  Diagnosis Date   Glaucoma    Hypertension    Migraine    No past surgical history on file.  Allergies  No Known Allergies  History of Present Illness    Sean Page is a 35 y.o. male with a hx of migraines, hypertension, tobacco use, alcohol use, corneal transplant at [facility] last seen 11/24/20.  He was initially seen 01/2019 for tachycardia. His Atenolol was increased to 50mg  daily. TTE was ordered given dyspnea on exertion with no significant abnormalities. ZIO patch worn for 3 days with no significant arrhythmias.   He was seen 08/14/20 by Dr. 08/16/20. Noted working a lot because of promotion. Noted chest pain which began after back surgery 3 years prior which was fleeting and self resolved. As it was nonexertional likely musculoskeletal, ischemic workup as deferred.  Seen in follow up 11/14/20 with persistent chest pain and dyspnea. Echo and cardiac CTA ordered.. 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 Continues to drink multiple nights per week with liquor reserved to the weekend.   Labs via PCP 10/16/20: ALT 28, AST 21, creatinine 1.02, Hb 15.6, K 4.6  Echo 12/05/20 LVEF 60-65%, no RWMA, no valvular abnormalities. Cardiac CTA with calcium score of 1. Rosuvastatin 10mg  QD was initiated.   Presents today for follow up. No recurred chest pain. Dyspnea on exertion stable at baseline. Continues to smoke and not  interested in quitting at this time.. No edema, orthopnea, PND. Reports occasional palpitations at night time and is compliant with Atenolol 50mg  QD. Tolerating Crestor without difficulty, no myalgias. Does endorse recent migraine which we discussed would be uncommon side effect as migraine started prior to starting Crestor.    EKGs/Labs/Other Studies Reviewed:   The following studies were reviewed today:  Echo 12/05/20  1. Left ventricular ejection fraction, by estimation, is 60 to 65%. Left  ventricular ejection fraction by 3D volume is 60 %. The left ventricle has  normal function. The left ventricle has no regional wall motion  abnormalities. Left ventricular diastolic   parameters were normal.   2. Right ventricular systolic function is normal. The right ventricular  size is normal. There is normal pulmonary artery systolic pressure. The  estimated right ventricular systolic pressure is 22.5 mmHg.   3. The mitral valve is grossly normal. Trivial mitral valve  regurgitation.   4. The aortic valve is tricuspid. Aortic valve regurgitation is not  visualized.   5. The inferior vena cava is normal in size with greater than 50%  respiratory variability, suggesting right atrial pressure of 3 mmHg.   Comparison(s): Changes from prior study are noted. 03/13/2019: LVEF  55-60%.   Conclusion(s)/Recommendation(s): Consider non-cardiac causes of dyspnea.   Cardiac CTA 11/28/20 FINDINGS: Image quality: Average   Noise artifact is: Moderate misregistration and decreased signal to noise ratio due to body habitus.   Coronary calcium score is 1.  Calcium scoring not validated for patients <45 yo. When compared to white males age 78, the patient in the 75th percentile for age and sex matched control.   Coronary arteries: Normal coronary origins.  Right dominance.   Right Coronary Artery: Minimal mixed atherosclerotic plaque in the proximal RCA, <25% stenosis.   Left Main Coronary Artery: No  detectable plaque or stenosis.   Left Anterior Descending Coronary Artery: Minimal mixed atherosclerotic plaque in the proximal LAD, <25% stenosis.   Left Circumflex Artery: No detectable plaque or stenosis.   Aorta: Normal size, 31 mm at the mid ascending aorta (level of the PA bifurcation) measured double oblique. No calcifications. No dissection.   Aortic Valve: No calcifications.   Other findings:   Normal pulmonary vein drainage into the left atrium.   Normal left atrial appendage without a thrombus.   Mild dilation of main pulmonary artery, 31 mm, may suggest increased pulmonary pressure.   IMPRESSION: 1. Minimal CAD, CADRADS = 1.   2. Coronary calcium score of 1. Calcium scoring not validated for patients <45 yo. When compared to white males age 33, the patient in the 75th percentile for age and sex matched control.   3. Normal coronary origin with right dominance.  EKG:  No EKG is ordered today.  The ekg ordered today independently reviewed from 11/14/20 demonstrated NSR 73 bpm   Recent Labs: 02/18/2020: ALT 24; BNP 35.0; TSH 2.030 11/27/2020: BUN 12; Creatinine, Ser 0.81; Potassium 5.1; Sodium 143  Recent Lipid Panel    Component Value Date/Time   CHOL 230 (H) 02/18/2020 1122   TRIG 163 (H) 02/18/2020 1122   HDL 47 02/18/2020 1122   CHOLHDL 4.9 02/18/2020 1122   LDLCALC 153 (H) 02/18/2020 1122    Home Medications   Current Meds  Medication Sig   albuterol (VENTOLIN HFA) 108 (90 Base) MCG/ACT inhaler Inhale 2 puffs into the lungs every 6 (six) hours as needed for wheezing or shortness of breath.   amLODipine (NORVASC) 10 MG tablet Take 1 tablet (10 mg total) by mouth daily.   atenolol (TENORMIN) 50 MG tablet Take 1 tablet (50 mg total) by mouth daily.   methocarbamol (ROBAXIN) 500 MG tablet Take 1 tablet (500 mg total) by mouth 2 (two) times daily. Stop using flexeril (Patient taking differently: Take 500 mg by mouth 2 (two) times daily as needed for muscle  spasms.)   prednisoLONE acetate (PRED FORTE) 1 % ophthalmic suspension Place 1 drop into the left eye daily.   rosuvastatin (CRESTOR) 10 MG tablet Take 1 tablet (10 mg total) by mouth daily.   timolol (TIMOPTIC) 0.5 % ophthalmic solution Place 1 drop into both eyes daily.     Review of Systems      All other systems reviewed and are otherwise negative except as noted above.  Physical Exam    VS:  BP 112/78   Pulse 89   Ht 5\' 10"  (1.778 m)   Wt 275 lb 6.4 oz (124.9 kg)   SpO2 98%   BMI 39.52 kg/m  , BMI Body mass index is 39.52 kg/m.  Wt Readings from Last 3 Encounters:  12/18/20 275 lb 6.4 oz (124.9 kg)  11/24/20 266 lb 12.1 oz (121 kg)  11/14/20 268 lb (121.6 kg)     GEN: Well nourished, overweight, well developed, in no acute distress. HEENT: normal. Neck: Supple, no JVD, carotid bruits, or masses. Cardiac: RRR, no murmurs, rubs, or gallops. No clubbing, cyanosis, edema.  Radials/PT 2+ and equal bilaterally.  Respiratory:  Respirations regular and unlabored, clear to auscultation bilaterally. GI: Soft, nontender, nondistended. MS: No deformity or atrophy. Skin: Warm and dry, no rash. Neuro:  Strength and sensation are intact. Psych: Normal affect.  Assessment & Plan    Nonobstructive CAD / HLD, LDL goal <70 - Calcium score 11/2020 with calcium score of 1. Continue Crestor 10mg  QD. Start Asprin 81mg  QD. Repeat CMP, fasting lipid panel in 4 weeks. Heart healthy diet and regular cardiovascular exercise encouraged.  Smoking cessation encouraged.    Alcohol and tobacco use - Alcohol cessation encouraged. Smoking cessation encouraged. Recommend utilization of 1800QUITNOW. Not interested in quitting at this time.   Dyspnea - Likely etiology deconditioning. Echo with no evidence of HF. Coronary calcium score of 1. Increased exercise encouraged. Offered referral to pulmonology which he politely declines.   HTN - BP well controlled. Continue current antihypertensive regimen.     Tachycardia - No recurrence. Continue current dose Atenolol  Disposition: Follow up in 6 months with Dr. or APP.  Signed, , NP 12/18/2020, 3:16 PM [facility]

## 2021-01-26 LAB — COMPREHENSIVE METABOLIC PANEL
ALT: 36 IU/L (ref 0–44)
AST: 24 IU/L (ref 0–40)
Albumin/Globulin Ratio: 1.9 (ref 1.2–2.2)
Albumin: 4.3 g/dL (ref 4.0–5.0)
Alkaline Phosphatase: 125 IU/L — ABNORMAL HIGH (ref 44–121)
BUN/Creatinine Ratio: 15 (ref 9–20)
BUN: 13 mg/dL (ref 6–20)
Bilirubin Total: 0.5 mg/dL (ref 0.0–1.2)
CO2: 23 mmol/L (ref 20–29)
Calcium: 9.4 mg/dL (ref 8.7–10.2)
Chloride: 104 mmol/L (ref 96–106)
Creatinine, Ser: 0.88 mg/dL (ref 0.76–1.27)
Globulin, Total: 2.3 g/dL (ref 1.5–4.5)
Glucose: 101 mg/dL — ABNORMAL HIGH (ref 70–99)
Potassium: 4.4 mmol/L (ref 3.5–5.2)
Sodium: 143 mmol/L (ref 134–144)
Total Protein: 6.6 g/dL (ref 6.0–8.5)
eGFR: 114 mL/min/{1.73_m2} (ref >59)

## 2021-01-26 LAB — LIPID PANEL
Chol/HDL Ratio: 2.9 ratio (ref 0.0–5.0)
Cholesterol, Total: 141 mg/dL (ref 100–199)
HDL: 49 mg/dL (ref >39)
LDL Chol Calc (NIH): 71 mg/dL (ref 0–99)
Triglycerides: 115 mg/dL (ref 0–149)
VLDL Cholesterol Cal: 21 mg/dL (ref 5–40)

## 2021-03-09 ENCOUNTER — Telehealth (HOSPITAL_BASED_OUTPATIENT_CLINIC_OR_DEPARTMENT_OTHER): Payer: Self-pay | Admitting: Cardiology

## 2021-03-09 NOTE — Telephone Encounter (Signed)
Left message for patient to call and schedule follow up appointment in April 2023 with Dr. Bjorn Pippin

## 2021-06-29 ENCOUNTER — Ambulatory Visit (INDEPENDENT_AMBULATORY_CARE_PROVIDER_SITE_OTHER): Payer: 59 | Admitting: Cardiology

## 2021-06-29 ENCOUNTER — Encounter: Payer: Self-pay | Admitting: Cardiology

## 2021-06-29 VITALS — BP 140/96 | HR 84 | Ht 69.0 in | Wt 295.0 lb

## 2021-06-29 DIAGNOSIS — I2510 Atherosclerotic heart disease of native coronary artery without angina pectoris: Secondary | ICD-10-CM

## 2021-06-29 DIAGNOSIS — E785 Hyperlipidemia, unspecified: Secondary | ICD-10-CM

## 2021-06-29 DIAGNOSIS — I10 Essential (primary) hypertension: Secondary | ICD-10-CM

## 2021-06-29 DIAGNOSIS — R400 Somnolence: Secondary | ICD-10-CM | POA: Diagnosis not present

## 2021-06-29 DIAGNOSIS — Z720 Tobacco use: Secondary | ICD-10-CM

## 2021-06-29 MED ORDER — LOSARTAN POTASSIUM 25 MG PO TABS: 25.0000 mg | ORAL_TABLET | Freq: Every day | ORAL | 3 refills | Status: DC

## 2021-06-29 NOTE — Patient Instructions (Signed)
Medication Instructions:  ?START Losartan 25 mg daily ? ?*If you need a refill on your cardiac medications before your next appointment, please call your pharmacy* ? ? ?Lab Work: ?Please return for labs (ok to have these completed at appointment with pharmacist) (BMET, Lipid) ? ?Our in office lab hours are Monday-Friday 8:00-4:00, closed for lunch 12:45-1:45 pm.  No appointment needed. ? ?Testing/Procedures: ?Your physician has recommended that you have a sleep study. This test records several body functions during sleep, including: brain activity, eye movement, oxygen and carbon dioxide blood levels, heart rate and rhythm, breathing rate and rhythm, the flow of air through your mouth and nose, snoring, body muscle movements, and chest and belly movement. ? ?Follow-Up: ?At [facility], you and your health needs are our priority.  As part of our continuing mission to provide you with exceptional heart care, we have created designated Provider Care Teams.  These Care Teams include your primary Cardiologist (physician) and Advanced Practice Providers (APPs -  Physician Assistants and Nurse Practitioners) who all work together to provide you with the care you need, when you need it. ? ?We recommend signing up for the patient portal called "MyChart".  Sign up information is provided on this After Visit Summary.  MyChart is used to connect with patients for Virtual Visits (Telemedicine).  Patients are able to view lab/test results, encounter notes, upcoming appointments, etc.  Non-urgent messages can be sent to your provider as well.   ?To learn more about what you can do with MyChart, go to [email or web address].   ? ?Your next appointment:   ?pharmD in 2 weeks ?--bring blood pressure log and blood pressure monitor from home ? ?6 months with Dr. Gardiner Rhyme ? ?Other Instructions ?Please check your blood pressure at home twice daily, write it down. Bring log and home blood pressure cuff to appointment with pharmacist.   ? ?

## 2021-06-29 NOTE — Progress Notes (Signed)
?Cardiology Office Note:   ? ?Date:  06/29/2021  ? ?ID:  Sean Page, DOB 13-Jan-1986, MRN [id number] ? ?PCP:  Dennie Maizes, NP  ?Cardiologist:  Little Ishikawa, MD  ?Electrophysiologist:  None  ? ?Referring MD: Dennie Maizes, NP  ? ?Chief Complaint  ?Patient presents with  ? Follow-up  ?  4 months.  ? Shortness of Breath  ? Edema  ?  Legs.  ? ? ?History of Present Illness:   ? ?Sean Page is a 36 y.o. male with a hx of migraines, hypertension, tobacco use, alcohol use presents for a follow-up visit.  He was initially seen on 02/01/2019 for an evaluation of tachycardia.  He denies any palpitations or chest pain.  Does report some dyspnea on exertion.  Has also noted lower extremity edema.  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  Since that time reports he has not been working and has been smoking and drinking more.  He was smoking 1.5 to 2 packs/day.  Has smoked for 20 years.  States that he was drinking whiskey, drinks over a pint per day.  Started on atenolol 25 mg daily a week prior for hypertension.   ? ?At initial clinic visit, atenolol was increased to 50 mg daily.  TTE was ordered given his dyspnea on exertion, which showed no significant abnormalities.  Given his tachycardia, a Zio patch x3 days was ordered, which showed no significant arrhythmias.  Coronary CTA on 11/28/21 showed minimal CAD (calcium score 1).  Echo 12/05/21 showed normal biventricular function, no significant valvular disease. ? ?Since last clinic visit, he reports that he is doing okay.  Reports chest pain improved, occurs rarely now.  Reports occasional dyspnea with exertion.  Continues to have lower extremity edema.  Reports occasional palpitations at night.  Reports he walks a lot at work but has not been exercising.  Denies any lightheadedness or syncope.  Continues to smoke, 1.5 packs/day ?  ? ? ?Past Medical History:  ?Diagnosis Date  ? Glaucoma   ? Hypertension   ? Migraine    ? ? ?History reviewed. No pertinent surgical history. ? ?Current Medications: ?Current Meds  ?Medication Sig  ? albuterol (VENTOLIN HFA) 108 (90 Base) MCG/ACT inhaler Inhale 2 puffs into the lungs every 6 (six) hours as needed for wheezing or shortness of breath.  ? amLODipine (NORVASC) 10 MG tablet Take 1 tablet (10 mg total) by mouth daily.  ? aspirin EC 81 MG tablet Take 1 tablet (81 mg total) by mouth daily. Swallow whole.  ? atenolol (TENORMIN) 50 MG tablet Take 1 tablet (50 mg total) by mouth daily.  ? losartan (COZAAR) 25 MG tablet Take 1 tablet (25 mg total) by mouth daily.  ? methocarbamol (ROBAXIN) 500 MG tablet Take 1 tablet (500 mg total) by mouth 2 (two) times daily. Stop using flexeril (Patient taking differently: Take 500 mg by mouth 2 (two) times daily as needed for muscle spasms.)  ? prednisoLONE acetate (PRED FORTE) 1 % ophthalmic suspension Place 1 drop into the left eye daily.  ? rosuvastatin (CRESTOR) 10 MG tablet Take 1 tablet (10 mg total) by mouth daily.  ? timolol (TIMOPTIC) 0.5 % ophthalmic solution Place 1 drop into both eyes daily.  ?  ? ?Allergies:   Patient has no known allergies.  ? ?Social History  ? ?Socioeconomic History  ? Marital status: Single  ?  Spouse name: Not on file  ? Number of  children: Not on file  ? Years of education: Not on file  ? Highest education level: Not on file  ?Occupational History  ? Not on file  ?Tobacco Use  ? Smoking status: Every Day  ? Smokeless tobacco: Never  ?Substance and Sexual Activity  ? Alcohol use: No  ? Drug use: No  ? Sexual activity: Not on file  ?Other Topics Concern  ? Not on file  ?Social History Narrative  ? Not on file  ? ?Social Determinants of Health  ? ?Financial Resource Strain: Not on file  ?Food Insecurity: Not on file  ?Transportation Needs: Not on file  ?Physical Activity: Not on file  ?Stress: Not on file  ?Social Connections: Not on file  ?  ? ?Family History: ?Father had stroke ? ?ROS:   ?Please see the history of present  illness.    ?All other systems reviewed and are negative. ? ?EKGs/Labs/Other Studies Reviewed:   ? ?The following studies were reviewed today: ? ? ?EKG:   ?06/29/21: Normal sinus rhythm, rate 84, no ST abnormalities ?08/14/2020- The EKG ordered today demonstrates Sinus rhythm, Rate 92  ?02/18/2020- The ekg ordered demonstrates sinus rhythm, rate 76 ? ?Recent Labs: ?01/26/2021: ALT 36; BUN 13; Creatinine, Ser 0.88; Potassium 4.4; Sodium 143  ?Recent Lipid Panel ?   ?Component Value Date/Time  ? CHOL 141 01/26/2021 1126  ? TRIG 115 01/26/2021 1126  ? HDL 49 01/26/2021 1126  ? CHOLHDL 2.9 01/26/2021 1126  ? LDLCALC 71 01/26/2021 1126  ? ? ?Physical Exam:   ? ?VS:  BP (!) 140/96 (BP Location: Left Arm, Patient Position: Sitting, Cuff Size: Large)   Pulse 84   Ht 5\' 9"  (1.753 m)   Wt 295 lb (133.8 kg)   BMI 43.56 kg/m?    ? ?Wt Readings from Last 3 Encounters:  ?06/29/21 295 lb (133.8 kg)  ?12/18/20 275 lb 6.4 oz (124.9 kg)  ?11/24/20 266 lb 12.1 oz (121 kg)  ?  ? ?GEN:  Well nourished, well developed in no acute distress ?HEENT: Normal ?NECK: No JVD; No carotid bruits ?LYMPHATICS: No lymphadenopathy ?CARDIAC: RRR, no murmurs, rubs, gallops ?RESPIRATORY:  Clear to auscultation without rales, wheezing or rhonchi  ?ABDOMEN: Soft, non-tender, non-distended ?MUSCULOSKELETAL:  1+ LE edema; No deformity  ?SKIN: Warm and dry ?NEUROLOGIC:  Alert and oriented x 3 ?PSYCHIATRIC:  Normal affect  ? ?TTE 03/13/19: ? 1. Left ventricular ejection fraction, by visual estimation, is 55 to  ?60%. The left ventricle has normal function. There is no left ventricular  ?hypertrophy.  ? 2. The left ventricle has no regional wall motion abnormalities.  ? 3. Global right ventricle has normal systolic function.The right  ?ventricular size is normal. No increase in right ventricular wall  ?thickness.  ? 4. Left atrial size was normal.  ? 5. Right atrial size was normal.  ? 6. Presence of pericardial fat pad.  ? 7. Trivial pericardial effusion is  present.  ? 8. The mitral valve is grossly normal. Trivial mitral valve  ?regurgitation.  ? 9. The tricuspid valve is grossly normal. Tricuspid valve regurgitation  ?is trivial.  ?10. The aortic valve is tricuspid. Aortic valve regurgitation is not  ?visualized. No evidence of aortic valve sclerosis or stenosis.  ?11. The pulmonic valve was grossly normal. Pulmonic valve regurgitation is  ?not visualized.  ?12. Normal pulmonary artery systolic pressure.  ?13. The tricuspid regurgitant velocity is 2.46 m/s, and with an assumed  ?right atrial pressure of 3 mmHg, the estimated  right ventricular systolic  ?pressure is normal at 27.2 mmHg.  ?14. The inferior vena cava is normal in size with greater than 50%  ?respiratory variability, suggesting right atrial pressure of 3 mmHg.  ?15. No prior Echocardiogram.  ?16. The interatrial septum appears to be lipomatous.  ? ?Cardiac monitor 03/11/19: ?No significant arrhythmias ?  ?3 days of data recorded on Zio monitor. Patient had a min HR of 46 bpm, max HR of 132 bpm, and avg HR of 92 bpm. Predominant underlying rhythm was Sinus Rhythm. No VT, SVT, atrial fibrillation, high degree block, or pauses noted. Isolated atrial and ventricular ectopy was rare (<1%). There were no triggered events. No significant arrhythmias detected. ? ?ASSESSMENT:   ? ?1. Nonobstructive atherosclerosis of coronary artery   ?2. Hyperlipidemia LDL goal <70   ?3. Essential hypertension   ?4. Daytime somnolence   ?5. Coronary artery disease involving native coronary artery of native heart without angina pectoris   ?6. Tobacco use   ? ? ?PLAN:   ? ? ?CAD: Reported atypical chest pain.  Coronary CTA on 11/28/21 showed minimal CAD (calcium score 1).  Echo 12/05/21 showed normal biventricular function, no significant valvular disease. ?-Continue rosuvastatin 10 mg daily.  We will check lipid panel ? ?Hypertension: On atenolol 50 mg daily and amlodipine 10 mg daily.  BP elevated, will add losartan 25 mg daily.   Asked to check BP daily for next 2 weeks and bring BP log and home BP monitor to calibrate to appointment with pharmacy hypertension clinic.  We will check BMET at that time.  Suspect untreated OSA contributing to

## 2021-07-08 ENCOUNTER — Other Ambulatory Visit: Payer: Self-pay | Admitting: Cardiology

## 2021-07-08 DIAGNOSIS — I10 Essential (primary) hypertension: Secondary | ICD-10-CM

## 2021-07-08 DIAGNOSIS — R400 Somnolence: Secondary | ICD-10-CM

## 2021-07-20 ENCOUNTER — Ambulatory Visit (INDEPENDENT_AMBULATORY_CARE_PROVIDER_SITE_OTHER): Payer: 59 | Admitting: Pharmacist

## 2021-07-20 VITALS — BP 146/92 | HR 72 | Resp 14 | Ht 70.0 in | Wt 295.0 lb

## 2021-07-20 DIAGNOSIS — E782 Mixed hyperlipidemia: Secondary | ICD-10-CM | POA: Insufficient documentation

## 2021-07-20 DIAGNOSIS — I10 Essential (primary) hypertension: Secondary | ICD-10-CM | POA: Diagnosis not present

## 2021-07-20 DIAGNOSIS — G43009 Migraine without aura, not intractable, without status migrainosus: Secondary | ICD-10-CM | POA: Insufficient documentation

## 2021-07-20 DIAGNOSIS — M5430 Sciatica, unspecified side: Secondary | ICD-10-CM | POA: Insufficient documentation

## 2021-07-20 DIAGNOSIS — I700 Atherosclerosis of aorta: Secondary | ICD-10-CM | POA: Insufficient documentation

## 2021-07-20 DIAGNOSIS — F1010 Alcohol abuse, uncomplicated: Secondary | ICD-10-CM | POA: Insufficient documentation

## 2021-07-20 DIAGNOSIS — Z720 Tobacco use: Secondary | ICD-10-CM | POA: Insufficient documentation

## 2021-07-20 MED ORDER — LOSARTAN POTASSIUM 50 MG PO TABS: 50.0000 mg | ORAL_TABLET | Freq: Every day | ORAL | 1 refills | Status: DC

## 2021-07-20 NOTE — Progress Notes (Signed)
Patient ID: GLENDALE YOUNGBLOOD                 DOB: 1985-10-11                      MRN: [id number] ? ? ? ? ?HPI: ?Sean Page is a 36 y.o. male referred by Dr. Bjorn Pippin to HTN clinic. PMH is significant for HTN, HLD, CAD, smoking, and obesity.  Patient was started on losartan 25mg  at last visit with Dr .  Has referral for sleep study. ? ?Patient presents today in good spirits. Works for [employer]. Lives with his mother. Got injured at work in 2018 and had to have spinal surgery. Reports continued back, leg, and hand pain which limits his physical activity. Has gained 100 pounds since surgery. ? ?Smokes 1-2 packs of cigarettes a day. Has no interested in quitting. ? ?Reports he eats one meal a day at work. Typically fast food, pizza, or 2019. Does not watch salt intake.  Drinks four to six 16 ounce sodas per day.  Prepares meals for his mother at home but does not eat them. Is going to try to cut down on his soda intake. ? ?Drinks at least 2 cups of coffee at home per day and drinks more during work. ? ?Did not bring BP log.  Reports home readings are similar to office readings. ? ? ?Current HTN meds:  ?Losartan 25mg  daily ?Atenolol 50mg  daily ?Amlodipine 10mg  daily ?  ?BP goal: <130/80 ? ?Wt Readings from Last 3 Encounters:  ?06/29/21 295 lb (133.8 kg)  ?12/18/20 275 lb 6.4 oz (124.9 kg)  ?11/24/20 266 lb 12.1 oz (121 kg)  ? ?BP Readings from Last 3 Encounters:  ?06/29/21 (!) 140/96  ?12/18/20 112/78  ?11/28/20 (!) 126/95  ? ?Pulse Readings from Last 3 Encounters:  ?06/29/21 84  ?12/18/20 89  ?11/24/20 82  ? ? ?Renal function: ?CrCl cannot be calculated (Patient's most recent lab result is older than the maximum 21 days allowed.). ? ?Past Medical History:  ?Diagnosis Date  ? Glaucoma   ? Hypertension   ? Migraine   ? ? ?Current Outpatient Medications on File Prior to Visit  ?Medication Sig Dispense Refill  ? albuterol (VENTOLIN HFA) 108 (90 Base) MCG/ACT inhaler Inhale 2 puffs into  the lungs every 6 (six) hours as needed for wheezing or shortness of breath.    ? amLODipine (NORVASC) 10 MG tablet Take 1 tablet (10 mg total) by mouth daily. 90 tablet 3  ? aspirin EC 81 MG tablet Take 1 tablet (81 mg total) by mouth daily. Swallow whole. 90 tablet 3  ? atenolol (TENORMIN) 50 MG tablet Take 1 tablet (50 mg total) by mouth daily. 90 tablet 3  ? losartan (COZAAR) 25 MG tablet Take 1 tablet (25 mg total) by mouth daily. 90 tablet 3  ? methocarbamol (ROBAXIN) 500 MG tablet Take 1 tablet (500 mg total) by mouth 2 (two) times daily. Stop using flexeril (Patient taking differently: Take 500 mg by mouth 2 (two) times daily as needed for muscle spasms.) 20 tablet 0  ? prednisoLONE acetate (PRED FORTE) 1 % ophthalmic suspension Place 1 drop into the left eye daily.    ? rosuvastatin (CRESTOR) 10 MG tablet Take 1 tablet (10 mg total) by mouth daily. 90 tablet 3  ? timolol (TIMOPTIC) 0.5 % ophthalmic solution Place 1 drop into both eyes daily.    ? ?No current facility-administered medications on file prior to  visit.  ? ? ?No Known Allergies ? ? ?Assessment/Plan: ? ?1. Hypertension -  Patient BP in room 146/92 whic is similar to last visits readings and above goal of <130/80.  Likely hypertension impacted by poor diet, sodium intake, pain, inactivity, and smoking.  Patient also drinks a large amount of sugary beverages a day but reports he will try to cut down.  Will order A1c since it has not been updated recently.   ? ?Recommended patient attempt to reduce salt and caffeine intake. Is not interested in quitting smoking at this time.   ? ?Will increase losartan to 50mg  daily at this time and recheck in 4 weeks ? ?Start losartan 50mg  daily ?Continue Atenolol 50mg  daily ?Continue Amlodipine 10mg  daily ? ? , PharmD, BCACP, CDCES, CPP ?[address] ?[place], , [place] ?Phone: [phone], Fax: [phone]  ?

## 2021-07-20 NOTE — Patient Instructions (Addendum)
It was nice meeting you today ? ?We would like your blood pressure to be less than 130/80 ? ?We will increase your losartan to 50mg  once a day ? ?Continue your atenolol 50mg  and amlodipine 10mg  daily ? ?I think some of your blood pressure issues may be due to salt, sugar, tobacco, and caffeine.  I'm glad you are going to try to start reducing the sugar content ? ?We will check your lab work today ? ?Karren Cobble, PharmD, BCACP, [place], CPP ?[address], [address] ?[place], [place], [address] ?Phone: [phone], Fax: [phone]  ?

## 2021-07-21 LAB — HEMOGLOBIN A1C
Est. average glucose Bld gHb Est-mCnc: 123 mg/dL
Hgb A1c MFr Bld: 5.9 % — ABNORMAL HIGH (ref 4.8–5.6)

## 2021-07-29 ENCOUNTER — Encounter: Payer: Self-pay | Admitting: *Deleted

## 2021-08-17 ENCOUNTER — Ambulatory Visit: Payer: 59 | Admitting: Pharmacist Clinician (PhC)/ Clinical Pharmacy Specialist

## 2021-08-17 DIAGNOSIS — I10 Essential (primary) hypertension: Secondary | ICD-10-CM

## 2021-08-17 LAB — LIPID PANEL
Chol/HDL Ratio: 2.8 ratio (ref 0.0–5.0)
Cholesterol, Total: 135 mg/dL (ref 100–199)
HDL: 49 mg/dL (ref >39)
LDL Chol Calc (NIH): 63 mg/dL (ref 0–99)
Triglycerides: 130 mg/dL (ref 0–149)
VLDL Cholesterol Cal: 23 mg/dL (ref 5–40)

## 2021-08-17 LAB — BASIC METABOLIC PANEL
BUN/Creatinine Ratio: 12 (ref 9–20)
BUN: 12 mg/dL (ref 6–20)
CO2: 21 mmol/L (ref 20–29)
Calcium: 9 mg/dL (ref 8.7–10.2)
Chloride: 108 mmol/L — ABNORMAL HIGH (ref 96–106)
Creatinine, Ser: 0.98 mg/dL (ref 0.76–1.27)
Glucose: 146 mg/dL — ABNORMAL HIGH (ref 70–99)
Potassium: 3.9 mmol/L (ref 3.5–5.2)
Sodium: 142 mmol/L (ref 134–144)
eGFR: 102 mL/min/{1.73_m2} (ref >59)

## 2021-08-17 NOTE — Patient Instructions (Signed)
Return for a a follow up appointment with Dr. Bjorn Pippin for September  Go to the lab today.  If you want to talk to our health coach (Amy Nedra Hai) about lifestyle modifications, please call the office to schedule a time with her.  Take your BP meds as follows:  Continue with your current medications.  Bring all of your meds, your BP cuff and your record of home blood pressures to your next appointment.  Exercise as you're able, try to walk approximately 30 minutes per day.  Keep salt intake to a minimum, especially watch canned and prepared boxed foods.  Eat more fresh fruits and vegetables and fewer canned items.  Avoid eating in fast food restaurants.    HOW TO TAKE YOUR BLOOD PRESSURE: Rest 5 minutes before taking your blood pressure.  Don't smoke or drink caffeinated beverages for at least 30 minutes before. Take your blood pressure before (not after) you eat. Sit comfortably with your back supported and both feet on the floor (don't cross your legs). Elevate your arm to heart level on a table or a desk. Use the proper sized cuff. It should fit smoothly and snugly around your bare upper arm. There should be enough room to slip a fingertip under the cuff. The bottom edge of the cuff should be 1 inch above the crease of the elbow. Ideally, take 3 measurements at one sitting and record the average.   Important Information About Sugar

## 2021-08-17 NOTE — Progress Notes (Signed)
08/17/2021 Sean Page 1985-04-17 [id number]   HPI:  Sean Page is a 36 y.o. male referred by Dr. Gardiner Rhyme to HTN clinic. PMH is significant for HTN, HLD, CAD, smoking, and obesity.  Patient was started on losartan 25mg  at last visit with Dr Gardiner Rhyme.  Has referral for sleep study.   He was seen last month by Karren Cobble PharmD, who increased the losartan to 50 mg daily.  He also encouraged cutting back on soda consumption as well as sodium.    Today he returns for follow up.  He notes that since his last visit he has switched from regular to diet sodas, and reduced the amount of sugar in his tea.  Has not lost any weight yet, but does feel that the swelling in his legs is less problematic.  States he doesn't want to go on a "diet", but instead is working on lifestyle modifications.  Continues to smoke.    Past Medical History: hyperlipidemia 10/22 LDL 71 (down from 153) on rosuvastatin 10  CAD Non-obstructive 9/22 calcium score (no percentile 2/2 age < 45)(if age 62 would be 75th percentile)   Pre-diabetes 4/23 A1c 5.9  obesity BMI 42.33, notes he gaine 100 lbs after back injury  C6 spinal injury 2018 work injury - has spinal surgery, now chronic back/leg/hand pain, limits physical activity     Blood Pressure Goal:  130/80  Current Medications: losartan 50 mg qd, atenolol 50 mg qd, amlodipine 10 mg qd - all at same time  Family Hx: father died from stroke at 26, had htn, mothe with htn, pre-diabetic; 1 sister, health unknown;   Social Hx: smokes 1-2 ppd - not interested in quitting; 2+ cups coffee daily, 4-6 16oz sodas per day  Diet: eating 1 meal/day out, while at work - [employer], pizza, [place];   Breakfast - coffee  Dinner - meats, - steak, chicken (occasionally fried) mostly, some vegetables, rare pork or fish; not much for snacking;   Exercise: limited 2/2 back injury  Home BP readings: home cuff died, even with new batteries still not working  right  Intolerances: nkda  Labs: 10/22:  Na 143, K 4.4, Glu 101, BUN 13, SCr 0.88, GFR 114  TC 141, TG 115, HDL 49, LDL 71 (on rosuvastatin 10)   Wt Readings from Last 3 Encounters:  08/17/21 294 lb (133.4 kg)  07/20/21 295 lb (133.8 kg)  06/29/21 295 lb (133.8 kg)   BP Readings from Last 3 Encounters:  08/17/21 122/76  07/20/21 (!) 146/92  06/29/21 (!) 140/96   Pulse Readings from Last 3 Encounters:  08/17/21 95  07/20/21 72  06/29/21 84    Current Outpatient Medications  Medication Sig Dispense Refill   albuterol (VENTOLIN HFA) 108 (90 Base) MCG/ACT inhaler Inhale 2 puffs into the lungs every 6 (six) hours as needed for wheezing or shortness of breath.     aspirin EC 81 MG tablet Take 1 tablet (81 mg total) by mouth daily. Swallow whole. 90 tablet 3   atenolol (TENORMIN) 50 MG tablet Take 1 tablet (50 mg total) by mouth daily. 90 tablet 3   losartan (COZAAR) 50 MG tablet Take 1 tablet (50 mg total) by mouth daily. 30 tablet 1   methocarbamol (ROBAXIN) 500 MG tablet Take 1 tablet (500 mg total) by mouth 2 (two) times daily. Stop using flexeril (Patient taking differently: Take 500 mg by mouth 2 (two) times daily as needed for muscle spasms.) 20 tablet 0  prednisoLONE acetate (PRED FORTE) 1 % ophthalmic suspension Place 1 drop into the left eye daily.     rosuvastatin (CRESTOR) 10 MG tablet Take 1 tablet (10 mg total) by mouth daily. 90 tablet 3   timolol (TIMOPTIC) 0.5 % ophthalmic solution Place 1 drop into both eyes daily.     amLODipine (NORVASC) 10 MG tablet Take 1 tablet (10 mg total) by mouth daily. 90 tablet 3   No current facility-administered medications for this visit.    No Known Allergies  Past Medical History:  Diagnosis Date   Glaucoma    Hypertension    Migraine     Blood pressure 122/76, pulse 95, resp. rate 15, height 5\' 4"  (1.626 m), weight 294 lb (133.4 kg), SpO2 96 %.  Hypertension Patient with essential hypertension, currently doing better  with combination of losartan, atenolol and amlodipine.  Patient was praised on cutting out sugar in sodas/teas, and his desire to cut back on volume of these drinks as well.  Offered him information on our Health Coach and gave her name should he be interested in assistance.  For now will have him continue with his current medications.  Reviewed A1c results from last visit and will have him go to the lab today for BMET and lipids.  He did have a bowl of cereal this morning.   He is to schedule follow up with Dr. Gardiner Rhyme for September.     Tommy Medal PharmD CPP [facility] [address] [place], [place] [address] [phone]

## 2021-08-17 NOTE — Assessment & Plan Note (Signed)
Patient with essential hypertension, currently doing better with combination of losartan, atenolol and amlodipine.  Patient was praised on cutting out sugar in sodas/teas, and his desire to cut back on volume of these drinks as well.  Offered him information on our Health Coach and gave her name should he be interested in assistance.  For now will have him continue with his current medications.  Reviewed A1c results from last visit and will have him go to the lab today for BMET and lipids.  He did have a bowl of cereal this morning.   He is to schedule follow up with Dr. Bjorn Pippin for September.

## 2021-08-21 ENCOUNTER — Ambulatory Visit (HOSPITAL_BASED_OUTPATIENT_CLINIC_OR_DEPARTMENT_OTHER): Payer: 59 | Attending: Cardiology | Admitting: Cardiovascular Disease

## 2021-08-21 ENCOUNTER — Encounter: Payer: Self-pay | Admitting: *Deleted

## 2021-08-21 DIAGNOSIS — R0683 Snoring: Secondary | ICD-10-CM | POA: Insufficient documentation

## 2021-08-21 DIAGNOSIS — I10 Essential (primary) hypertension: Secondary | ICD-10-CM | POA: Insufficient documentation

## 2021-08-21 DIAGNOSIS — R400 Somnolence: Secondary | ICD-10-CM | POA: Insufficient documentation

## 2021-08-21 DIAGNOSIS — G4733 Obstructive sleep apnea (adult) (pediatric): Secondary | ICD-10-CM | POA: Diagnosis not present

## 2021-08-21 DIAGNOSIS — G4736 Sleep related hypoventilation in conditions classified elsewhere: Secondary | ICD-10-CM | POA: Insufficient documentation

## 2021-08-30 ENCOUNTER — Other Ambulatory Visit: Payer: Self-pay | Admitting: Cardiology

## 2021-09-03 ENCOUNTER — Encounter (HOSPITAL_BASED_OUTPATIENT_CLINIC_OR_DEPARTMENT_OTHER): Payer: Self-pay | Admitting: Cardiovascular Disease

## 2021-09-03 NOTE — Procedures (Signed)
      Patient Name: Sean Page, Sean Page Date: 08/24/2021 Gender: Male D.O.B: 04-03-1985 Age (years): 35 Referring Provider: Epifanio Lesches Height (inches): 70 Interpreting Physician: Nicki Guadalajara MD, ABSM Weight (lbs): 288 RPSGT: [place] BMI: 41 MRN: [id number] Neck Size: 20.00  CLINICAL INFORMATION Sleep Study Type: HST  Indication for sleep study: snoring, daytime somnolence, HTN  Epworth Sleepiness Score: 7  SLEEP STUDY TECHNIQUE A multi-channel overnight portable sleep study was performed. The channels recorded were: nasal airflow, thoracic respiratory movement, and oxygen saturation with a pulse oximetry. Snoring was also monitored.  MEDICATIONS albuterol (VENTOLIN HFA) 108 (90 Base) MCG/ACT inhaler amLODipine (NORVASC) 10 MG tablet aspirin EC 81 MG tablet atenolol (TENORMIN) 50 MG tablet losartan (COZAAR) 50 MG tablet methocarbamol (ROBAXIN) 500 MG tablet prednisoLONE acetate (PRED FORTE) 1 % ophthalmic suspension rosuvastatin (CRESTOR) 10 MG tablet timolol (TIMOPTIC) 0.5 % ophthalmic solution Patient self administered medications include: N/A.  SLEEP ARCHITECTURE Patient was studied for 366 minutes. The sleep efficiency was 100.0 % and the patient was supine for 0%. The arousal index was 0.0 per hour.  RESPIRATORY PARAMETERS The overall AHI was 104.8 per hour, with a central apnea index of 0 per hour.  The oxygen nadir was 81% during sleep.  CARDIAC DATA Mean heart rate during sleep was 80.5 bpm.  IMPRESSIONS - Severe obstructive sleep apnea occurred during this study (AHI 104.8/h). - Moderate oxygen desaturation was noted during this study (Min O2 81%). - Patient snored for 32.2 minutes (8.8%) during the sleep.  DIAGNOSIS - Obstructive Sleep Apnea (G47.33) - Nocturnal Hypoxemia (G47.36)  RECOMMENDATIONS - In this patient with very severe sleep apnea recommend an in-lab CAP titration to determine optimal pressure and device  for for treatment.  If unable to schedule an in lab titration, initiate Auto-PAP with EPR of 3  at 8 - 20 cm of water. - Avoid alcohol, sedatives and other CNS depressants that may worsen sleep apnea and disrupt normal sleep architecture. - Sleep hygiene should be reviewed to assess factors that may improve sleep quality. - Weight management and regular exercise should be initiated or continued. - Recommend a download and sleep clinic evaluation after one month of therapy.   [Electronically signed] 09/03/2021 08:12 PM  Nicki Guadalajara MD, [facility], ABSM Diplomate, American Board of Sleep Medicine  NPI: [id number]  [facility] PH: [phone]   FX: [phone] ACCREDITED BY THE AMERICAN ACADEMY OF SLEEP MEDICINE

## 2021-09-06 ENCOUNTER — Other Ambulatory Visit: Payer: Self-pay | Admitting: Cardiology

## 2021-09-06 DIAGNOSIS — I10 Essential (primary) hypertension: Secondary | ICD-10-CM

## 2021-09-07 ENCOUNTER — Other Ambulatory Visit: Payer: Self-pay | Admitting: Cardiology

## 2021-09-08 ENCOUNTER — Other Ambulatory Visit: Payer: Self-pay | Admitting: Cardiovascular Disease

## 2021-09-08 ENCOUNTER — Telehealth: Payer: Self-pay | Admitting: *Deleted

## 2021-09-08 DIAGNOSIS — I10 Essential (primary) hypertension: Secondary | ICD-10-CM

## 2021-09-08 DIAGNOSIS — G4736 Sleep related hypoventilation in conditions classified elsewhere: Secondary | ICD-10-CM

## 2021-09-08 DIAGNOSIS — G4733 Obstructive sleep apnea (adult) (pediatric): Secondary | ICD-10-CM

## 2021-09-08 NOTE — Telephone Encounter (Signed)
-----   Message from Thomas A Kelly, MD sent at 09/03/2021  8:17 PM EDT ----- Sean Page, please notify the patient of the severity of his obstructive sleep apnea.  AHI is 104.8.  Try for in lab CPAP titration. 

## 2021-09-08 NOTE — Telephone Encounter (Signed)
Prior Authorization for CPAP titration sent to [facility] via web portal. Tracking Number [id number].

## 2021-09-08 NOTE — Telephone Encounter (Signed)
-----   Message from Troy Sine, MD sent at 09/03/2021  8:17 PM EDT ----- Mariann Laster, please notify the patient of the severity of his obstructive sleep apnea.  AHI is 104.8.  Try for in lab CPAP titration.

## 2021-09-08 NOTE — Telephone Encounter (Signed)
Patient notified HST shows severe OSA. Dr Tresa Endo recommends CPAP titration. He agrees to proceed. He has no questions at this time.

## 2021-09-11 NOTE — Telephone Encounter (Signed)
Received a phone message from [facility] informing me of denial for CPAP titration. Patient notified and informed APAP order will be sent to [facility].

## 2021-09-18 ENCOUNTER — Telehealth: Payer: Self-pay | Admitting: *Deleted

## 2021-09-21 ENCOUNTER — Other Ambulatory Visit: Payer: Self-pay | Admitting: Cardiology

## 2021-10-18 ENCOUNTER — Other Ambulatory Visit: Payer: Self-pay | Admitting: Cardiology

## 2021-11-15 ENCOUNTER — Other Ambulatory Visit: Payer: Self-pay | Admitting: Cardiology

## 2021-11-16 ENCOUNTER — Telehealth: Payer: Self-pay | Admitting: *Deleted

## 2021-11-16 NOTE — Telephone Encounter (Signed)
Left  patient a message to return a call to me to discuss his returning his CPAP machine to [facility]. Per Jasmine December @ [facility] patient states that he cannot tolerate his CPAP and wants to get a inspire device.

## 2021-11-16 NOTE — Telephone Encounter (Signed)
Patient returned a call to me and we discussed the fact that due to his BMI being 50.44 he will not qualify for a inspire device. We talked about the issues he is having. He states that the pressure is blowing the mask away from his face, putting "pressure on my eyes." He states that he called [facility] multiple times to see if he could get a pressure decrease and did not receive a return call. I told the patient that after reviewing his download in [employer] it does not show that he is having a mask leak. It does show that on occasion his pressure will go up as high as 19 or 20 CmH2O. He has a F-30i mask which may not be able to hold the high pressures. It was recommended that he go back to [facility] and get his machine. Jasmine December states that she will hold it and not issue a return. I will have them to change his mask to a over the nose bridge style. Either a F20 or Airtouch F20 mask. Patient was also informed that he really needs to try his best to use the machine. If he is non compliant and or his machine is taken away, or if he turns it in and decided later on that he wants to retry it he will have to start completely over from the beginning with a new sleep study. Patient voiced understanding and said that he will go back to [facility] and get the machine. Sunday Shams notified patient is on the way back to get his machine.

## 2021-12-15 DIAGNOSIS — H2703 Aphakia, bilateral: Secondary | ICD-10-CM | POA: Insufficient documentation

## 2021-12-15 DIAGNOSIS — Z947 Corneal transplant status: Secondary | ICD-10-CM | POA: Insufficient documentation

## 2021-12-25 ENCOUNTER — Other Ambulatory Visit: Payer: Self-pay | Admitting: Family

## 2021-12-28 ENCOUNTER — Telehealth: Payer: Self-pay | Admitting: *Deleted

## 2021-12-28 ENCOUNTER — Ambulatory Visit: Payer: 59 | Attending: Cardiovascular Disease | Admitting: Cardiovascular Disease

## 2021-12-28 VITALS — BP 118/84 | HR 83 | Ht 69.0 in | Wt 283.2 lb

## 2021-12-28 DIAGNOSIS — G4733 Obstructive sleep apnea (adult) (pediatric): Secondary | ICD-10-CM

## 2021-12-28 DIAGNOSIS — R0609 Other forms of dyspnea: Secondary | ICD-10-CM

## 2021-12-28 DIAGNOSIS — I2510 Atherosclerotic heart disease of native coronary artery without angina pectoris: Secondary | ICD-10-CM | POA: Diagnosis not present

## 2021-12-28 DIAGNOSIS — Z789 Other specified health status: Secondary | ICD-10-CM

## 2021-12-28 DIAGNOSIS — I10 Essential (primary) hypertension: Secondary | ICD-10-CM | POA: Diagnosis not present

## 2021-12-28 DIAGNOSIS — G4736 Sleep related hypoventilation in conditions classified elsewhere: Secondary | ICD-10-CM

## 2021-12-28 DIAGNOSIS — Z720 Tobacco use: Secondary | ICD-10-CM

## 2021-12-28 NOTE — Patient Instructions (Signed)
Medication Instructions:  No changes   *If you need a refill on your cardiac medications before your next appointment, please call your pharmacy*  Other Instructions    pressure changes were done to your C-PAP - to 8-15   Once you purchase a new C-PAP machine please contact office for the machine to [place] person Sean Page. ( Sleep coordinator )  Lab Work:  Not needed   Testing/Procedures: Not needed  Follow-Up: At [facility], you and your health needs are our priority.  As part of our continuing mission to provide you with exceptional heart care, we have created designated Provider Care Teams.  These Care Teams include your primary Cardiologist (physician) and Advanced Practice Providers (APPs -  Physician Assistants and Nurse Practitioners) who all work together to provide you with the care you need, when you need it.     Your next appointment:   4 month(s) Sleep Clinic  The format for your next appointment:   In Person  Provider:   Dr Shelva Majestic     Other Instructions    pressure changes were done to your C-PAP - to 8-15   Once you purchase a new C-PAP machine please contact office for the machine to [place] person Sean Page. ( Sleep coordinator )

## 2021-12-28 NOTE — Telephone Encounter (Signed)
Rx(s) sent to pharmacy electronically.  

## 2021-12-28 NOTE — Progress Notes (Unsigned)
Cardiology Office Note    Date:  12/31/2021   ID:  Sean Page, DOB [birth year], MRN [id number]  PCP:  Eilene Ghazi, NP  Cardiologist:  Shelva Majestic, MD(sleep); Dr. Gardiner Rhyme  Initial sleep consultation referred by Dr. Gardiner Rhyme  History of Present Illness:  Sean Page is a 36 y.o. male who is followed by Dr. Nechama Guard for cardiology care.  He has a history of hypertension, migraine headaches, tobacco use as well as EtOH.  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  He has been on a medical regimen of atenolol 50 mg daily, amlodipine 10 mg and losartan 50 mg for hypertension.  He is on rosuvastatin for hyperlipidemia and takes as needed albuterol.  He has experienced dyspnea on exertion.  An echo is showing normal biventricular function.  He had noted palpitations typically at night.  Due to concerns for obstructive sleep apnea with his morbid obesity, history of snoring hypertension and daytime somnolence he was referred for sleep evaluation.  He underwent a home sleep study on Aug 24, 2021.  He was found to have very severe obstructive sleep apnea with an AHI of 104.8/h.  Oxygen desaturated to a nadir of 81%.  Snoring was present.  Apparently was not approved for in lab titration and consequently initiated AutoPap therapy after receiving AirSense 11 AutoSet unit with a set up date of October 05, 2021.  His initial pressure recommendations were minimum pressure of 8 with maximum of 20 and EPR of 3.  Apparently, use has been noncompliant.  A download over 2 months duration from July 19 through December 12, 2021 shows only 30 days of use.  Average use was 4 hours and 20 minutes but usage greater than 4 hours was only 20%.  He requires high pressure and his 95th percentile pressure is 17.3 with maximum average pressure of 18.3.  Currently he has had difficulty with mask.  He is bearded.  He wakes up due to the pressure blowing from the side of his  mask.  Typically he goes to bed around 11 PM and wakes up at 4:45 AM.  He experiences nocturia approximately 3-4 times per night.  An Epworth Sleepiness Scale score was calculated in the office today and this endorsed 4.  He presents for his initial sleep consultation.     Past Medical History:  Diagnosis Date   Glaucoma    Hypertension    Migraine     No past surgical history on file.  Current Medications: Outpatient Medications Prior to Visit  Medication Sig Dispense Refill   albuterol (VENTOLIN HFA) 108 (90 Base) MCG/ACT inhaler Inhale 2 puffs into the lungs every 6 (six) hours as needed for wheezing or shortness of breath.     amLODipine (NORVASC) 10 MG tablet Take 1 tablet by mouth once daily 90 tablet 3   aspirin EC 81 MG tablet Take 1 tablet (81 mg total) by mouth daily. Swallow whole. 90 tablet 3   atenolol (TENORMIN) 50 MG tablet Take 1 tablet by mouth once daily 90 tablet 3   losartan (COZAAR) 50 MG tablet Take 1 tablet by mouth once daily 30 tablet 3   methocarbamol (ROBAXIN) 500 MG tablet Take 1 tablet (500 mg total) by mouth 2 (two) times daily. Stop using flexeril 20 tablet 0   rosuvastatin (CRESTOR) 10 MG tablet Take 1 tablet by mouth once daily 90 tablet 1   prednisoLONE acetate (  PRED FORTE) 1 % ophthalmic suspension Place 1 drop into the left eye daily. (Patient not taking: Reported on 12/28/2021)     timolol (TIMOPTIC) 0.5 % ophthalmic solution Place 1 drop into both eyes daily. (Patient not taking: Reported on 12/28/2021)     No facility-administered medications prior to visit.     Allergies:   Patient has no known allergies.   Social History   Socioeconomic History   Marital status: Single    Spouse name: Not on file   Number of children: Not on file   Years of education: Not on file   Highest education level: Not on file  Occupational History   Not on file  Tobacco Use   Smoking status: Every Day   Smokeless tobacco: Never  Substance and Sexual Activity    Alcohol use: No   Drug use: No   Sexual activity: Not on file  Other Topics Concern   Not on file  Social History Narrative   Not on file   Social Determinants of Health   Financial Resource Strain: Not on file  Food Insecurity: Not on file  Transportation Needs: Not on file  Physical Activity: Not on file  Stress: Not on file  Social Connections: Not on file    Socially he is single.  He did attend some courses in college but did not obtain a degree.  He has a longstanding tobacco history and started smoking at age 63, currently 1-1/2 to 2 packs/day.  He does drink alcohol at times in excess.  He works for the city of [employer] and [employer].  Family History:  The patient's family history includes Hypertension in his father and mother; Stroke in his father.  His mother is 66.  Father is 41 and had suffered a stroke.  ROS General: Negative; No fevers, chills, or night sweats; morbid obesity HEENT: Negative; No changes in vision or hearing, sinus congestion, difficulty swallowing Pulmonary: Negative; No cough, wheezing, shortness of breath, hemoptysis Cardiovascular: Positive for palpitations, lower extremity edema GI: Negative; No nausea, vomiting, diarrhea, or abdominal pain GU: Negative; No dysuria, hematuria, or difficulty voiding Musculoskeletal: Negative; no myalgias, joint pain, or weakness Hematologic/Oncology: Negative; no easy bruising, bleeding Endocrine: Negative; no heat/cold intolerance; no diabetes Neuro: Positive for migraine headaches Skin: Negative; No rashes or skin lesions Psychiatric: Negative; No behavioral problems, depression Sleep: Negative; No snoring, daytime sleepiness, hypersomnolence, bruxism, restless legs, hypnogognic hallucinations, no cataplexy Other comprehensive 14 point system review is negative.   PHYSICAL EXAM:   VS:  BP 118/84   Pulse 83   Ht 5' 9"  (1.753 m)   Wt 283 lb 3.2 oz (128.5 kg)   SpO2 98%   BMI 41.82 kg/m      Repeat blood pressure by me was 128/84  Wt Readings from Last 3 Encounters:  12/28/21 283 lb 3.2 oz (128.5 kg)  08/17/21 294 lb (133.4 kg)  07/20/21 295 lb (133.8 kg)    General: Alert, oriented, no distress.  Skin: normal turgor, no rashes, warm and dry HEENT: Normocephalic, atraumatic. Pupils equal round and reactive to light; sclera anicteric; extraocular muscles intact;  Nose without nasal septal hypertrophy Mouth/Parynx; narrowed mouth and pharynx; Mallinpatti scale 4 Neck: No JVD, no carotid bruits; normal carotid upstroke Lungs: clear to ausculatation and percussion; no wheezing or rales Chest wall: without tenderness to palpitation Heart: PMI not displaced, RRR, s1 s2 normal, 1/6 systolic murmur, no diastolic murmur, no rubs, gallops, thrills, or heaves Abdomen: Central adiposity; soft, nontender;  no hepatosplenomehaly, BS+; abdominal aorta nontender and not dilated by palpation. Back: no CVA tenderness Pulses 2+ Musculoskeletal: full range of motion, normal strength, no joint deformities Extremities: no clubbing cyanosis or edema, Homan's sign negative  Neurologic: grossly nonfocal; Cranial nerves grossly wnl Psychologic: Normal mood and affect   Studies/Labs Reviewed:   December 28, 2021 ECG (independently read by me): NSR at 83; nonspecific ST changes  Recent Labs:    Latest Ref Rng & Units 08/17/2021   10:53 AM 01/26/2021   11:26 AM 11/27/2020    1:43 PM  BMP  Glucose 70 - 99 mg/dL 146  101  81   BUN 6 - 20 mg/dL 12  13  12    Creatinine 0.76 - 1.27 mg/dL 0.98  0.88  0.81   BUN/Creat Ratio 9 - 20 12  15  15    Sodium 134 - 144 mmol/L 142  143  143   Potassium 3.5 - 5.2 mmol/L 3.9  4.4  5.1   Chloride 96 - 106 mmol/L 108  104  105   CO2 20 - 29 mmol/L 21  23  23    Calcium 8.7 - 10.2 mg/dL 9.0  9.4  9.4         Latest Ref Rng & Units 01/26/2021   11:26 AM 02/18/2020   11:22 AM 01/25/2019    4:55 PM  Hepatic Function  Total Protein 6.0 - 8.5 g/dL 6.6  6.6   8.3   Albumin 4.0 - 5.0 g/dL 4.3  4.0  4.3   AST 0 - 40 IU/L 24  13  23    ALT 0 - 44 IU/L 36  24  38   Alk Phosphatase 44 - 121 IU/L 125  113  109   Total Bilirubin 0.0 - 1.2 mg/dL 0.5  0.3  0.3        Latest Ref Rng & Units 01/25/2019    4:55 PM 10/14/2011   11:00 AM 02/09/2011    9:50 PM  CBC  WBC 4.0 - 10.5 K/uL 14.1  9.9  20.5   Hemoglobin 13.0 - 17.0 g/dL 18.7  15.9  15.7   Hematocrit 39.0 - 52.0 % 56.4  50.8  46.6   Platelets 150 - 400 K/uL 291   219    Lab Results  Component Value Date   MCV 92.6 01/25/2019   MCV 90.1 10/14/2011   MCV 86.6 02/09/2011   Lab Results  Component Value Date   TSH 2.030 02/18/2020   Lab Results  Component Value Date   HGBA1C 5.9 (H) 07/20/2021     BNP    Component Value Date/Time   BNP 35.0 02/18/2020 1122    ProBNP No results found for: "PROBNP"   Lipid Panel     Component Value Date/Time   CHOL 135 08/17/2021 1053   TRIG 130 08/17/2021 1053   HDL 49 08/17/2021 1053   CHOLHDL 2.8 08/17/2021 1053   LDLCALC 63 08/17/2021 1053   LABVLDL 23 08/17/2021 1053     RADIOLOGY: No results found.   Additional studies/ records that were reviewed today include:     Patient Name: Advit, Sean Page Date: 08/24/2021 Gender: Male D.O.B: 05-10-1985 Age (years): 35 Referring Provider: Oswaldo Milian Height (inches): 26 Interpreting Physician: Shelva Majestic MD, ABSM Weight (lbs): 288 RPSGT: Jacolyn Reedy BMI: 41 MRN: [id number] Neck Size: 20.00   CLINICAL INFORMATION Sleep Study Type: HST   Indication for sleep study: snoring, daytime somnolence, HTN   Epworth Sleepiness Score: 7  SLEEP STUDY TECHNIQUE A multi-channel overnight portable sleep study was performed. The channels recorded were: nasal airflow, thoracic respiratory movement, and oxygen saturation with a pulse oximetry. Snoring was also monitored.   MEDICATIONS albuterol (VENTOLIN HFA) 108 (90 Base) MCG/ACT inhaler amLODipine (NORVASC) 10  MG tablet aspirin EC 81 MG tablet atenolol (TENORMIN) 50 MG tablet losartan (COZAAR) 50 MG tablet methocarbamol (ROBAXIN) 500 MG tablet prednisoLONE acetate (PRED FORTE) 1 % ophthalmic suspension rosuvastatin (CRESTOR) 10 MG tablet timolol (TIMOPTIC) 0.5 % ophthalmic solution Patient self administered medications include: N/A.   SLEEP ARCHITECTURE Patient was studied for 366 minutes. The sleep efficiency was 100.0 % and the patient was supine for 0%. The arousal index was 0.0 per hour.   RESPIRATORY PARAMETERS The overall AHI was 104.8 per hour, with a central apnea index of 0 per hour.   The oxygen nadir was 81% during sleep.   CARDIAC DATA Mean heart rate during sleep was 80.5 bpm.   IMPRESSIONS - Severe obstructive sleep apnea occurred during this study (AHI 104.8/h). - Moderate oxygen desaturation was noted during this study (Min O2 81%). - Patient snored for 32.2 minutes (8.8%) during the sleep.   DIAGNOSIS - Obstructive Sleep Apnea (G47.33) - Nocturnal Hypoxemia (G47.36)   RECOMMENDATIONS - In this patient with very severe sleep apnea recommend an in-lab CAP titration to determine optimal pressure and device for for treatment.  If unable to schedule an in lab titration, initiate Auto-PAP with EPR of 3  at 8 - 20 cm of water. - Avoid alcohol, sedatives and other CNS depressants that may worsen sleep apnea and disrupt normal sleep architecture. - Sleep hygiene should be reviewed to assess factors that may improve sleep quality. - Weight management and regular exercise should be initiated or continued. - Recommend a download and sleep clinic evaluation after one month of therapy.    ASSESSMENT:    1. Severe obstructive sleep apnea (adult) (pediatric)   2. Coronary artery disease involving native coronary artery of native heart without angina pectoris   3. Hypoxemia associated with sleep   4. Essential hypertension   5. Tobacco use   6. Alcohol use   7. Morbid obesity  ([place])   8. DOE (dyspnea on exertion)     PLAN:  Mr. Bashir Marchetti is a 36 year old patient of Dr. Oswaldo Milian who has a longstanding history of tobacco use since age 71, currently 2 packs/day, history of significant EtOH use, migraine headaches, as well as hypertension.  He is status post left eye cornea transplant.  He has had episodes of tachycardia and palpitations and wore a Zio patch monitor which was nonrevealing.  He has been found to have normal biventricular function.  Due to concerns for obstructive sleep apnea with snoring, fatigability, daytime sleepiness, a sleep study was performed which revealed severe obstructive sleep apnea with an AHI of 104.8.  He had significant oxygen desaturation to a nadir of 81%.  Unfortunately he was unable to undergo an in lab titration and AutoPap therapy was implemented with start up date on October 05, 2021.  He has had difficulty with CPAP and has had difficulty both with a nasal and fullface mask.  Apparently he has been waking up from the air has been blowing out from the sides of his mask making it more difficult for him to sleep.  Typically his sleep duration is sub-optimal; he goes to bed around 11 PM and wakes up at 4:30 am.  On his downloads leep duration was 4  hours and 20 minutes and usage days was 23 and 80%.  He has required high pressures with 95th percentile pressure at 17.3.  Unfortunately, he is not compliant.  I discussed with him optimal sleep duration at 7 and 9 hours for someone his age.  I also discussed with him insurance companies mandate of meeting compliance within 90 days of CPAP initiation.  He is frustrated with his DME company.  His sleep apnea is very severe.  I discussed with him options that if his DME company calls to return his machine that he should be given an opportunity to purchase the machine typically at a reduced cost.  Alternatively he has already been looking on the Internet to purchase a machine on his own without  the DME company.  I had a lengthy discussion with him today over 50 minutes regarding the importance of treating his sleep apnea.  I discussed normal sleep architecture as well as the effects of adverse consequences of untreated sleep apnea.  In particular I discussed its effects on blood pressure control, potential for nocturnal arrhythmias, increased risk for atrial fibrillation, as well as its negative effects on insulin resistance, increased inflammatory markers, and nocturnal GERD.  With his severe sleep apnea we discussed potential nocturnal hypoxemia in the future contributing to nocturnal ischemia both cardiac as well as cerebrovascular.  Presently he has been experiencing nocturia 3-4 times per night.  I discussed the pathophysiology implicated with this.  I also discussed the benefit of weight reduction and increased exercise.  I will change his machine settings from 8-20 down to 8-16 to see if this can prevent some of the leak.  Some of this may be compounded by his beard.  We discussed new mask options.  In anticipation that he will have purchased his present machine or has bought a new machine outside of insurance, I will plan to see him for follow-up evaluation in approximately 4 months for reevaluation.    Medication Adjustments/Labs and Tests Ordered: Current medicines are reviewed at length with the patient today.  Concerns regarding medicines are outlined above.  Medication changes, Labs and Tests ordered today are listed in the Patient Instructions below. Patient Instructions  Medication Instructions:  No changes   *If you need a refill on your cardiac medications before your next appointment, please call your pharmacy*  Other Instructions    pressure changes were done to your C-PAP - to 8-15   Once you purchase a new C-PAP machine please contact office for the machine to [place] person Erasmo Score. ( Sleep coordinator )  Lab Work:  Not needed   Testing/Procedures: Not  needed  Follow-Up: At [facility], you and your health needs are our priority.  As part of our continuing mission to provide you with exceptional heart care, we have created designated Provider Care Teams.  These Care Teams include your primary Cardiologist (physician) and Advanced Practice Providers (APPs -  Physician Assistants and Nurse Practitioners) who all work together to provide you with the care you need, when you need it.     Your next appointment:   4 month(s) Sleep Clinic  The format for your next appointment:   In Person  Provider:   Dr Shelva Majestic     Other Instructions    pressure changes were done to your C-PAP - to 8-15   Once you purchase a new C-PAP machine please contact office for the machine to [place] person Erasmo Score. ( Sleep  coordinator )   Signed, Shelva Majestic, MD , [facility], [place], American Board of Sleep Medicine  12/31/2021 3:17 PM    [place] [address], [place], [place], [place]  [address] Phone: [phone]

## 2021-12-28 NOTE — Telephone Encounter (Addendum)
Patient  was here for sleep clinic- compliance   Change   setting to 8 10 15   pressures  Completed  Patient may be purchasing a machine from [facility] or  online.  Per Dr Claiborne Billings, instructed the patient to contact the office and speak to [facility] Sleep services -so machine can be linked.  Patient verbalized understanding. Follow up in 4 months sleep clinic

## 2022-02-02 ENCOUNTER — Other Ambulatory Visit: Payer: Self-pay | Admitting: Cardiology

## 2022-02-02 DIAGNOSIS — I10 Essential (primary) hypertension: Secondary | ICD-10-CM

## 2022-02-14 ENCOUNTER — Emergency Department (HOSPITAL_COMMUNITY): Payer: 59

## 2022-02-14 ENCOUNTER — Emergency Department (HOSPITAL_COMMUNITY)
Admission: EM | Admit: 2022-02-14 | Discharge: 2022-02-14 | Disposition: A | Discharge status: Home / Self Care | Payer: 59 | Attending: Emergency Medicine | Admitting: Emergency Medicine

## 2022-02-14 DIAGNOSIS — M25561 Pain in right knee: Secondary | ICD-10-CM | POA: Insufficient documentation

## 2022-02-14 DIAGNOSIS — Z79899 Other long term (current) drug therapy: Secondary | ICD-10-CM | POA: Diagnosis not present

## 2022-02-14 DIAGNOSIS — M25571 Pain in right ankle and joints of right foot: Secondary | ICD-10-CM | POA: Diagnosis present

## 2022-02-14 DIAGNOSIS — M25551 Pain in right hip: Secondary | ICD-10-CM | POA: Diagnosis not present

## 2022-02-14 DIAGNOSIS — W19XXXA Unspecified fall, initial encounter: Secondary | ICD-10-CM

## 2022-02-14 DIAGNOSIS — I10 Essential (primary) hypertension: Secondary | ICD-10-CM | POA: Diagnosis not present

## 2022-02-14 DIAGNOSIS — W1830XA Fall on same level, unspecified, initial encounter: Secondary | ICD-10-CM | POA: Diagnosis not present

## 2022-02-14 DIAGNOSIS — S39012A Strain of muscle, fascia and tendon of lower back, initial encounter: Secondary | ICD-10-CM

## 2022-02-14 DIAGNOSIS — S93401A Sprain of unspecified ligament of right ankle, initial encounter: Secondary | ICD-10-CM

## 2022-02-14 DIAGNOSIS — Z7982 Long term (current) use of aspirin: Secondary | ICD-10-CM | POA: Diagnosis not present

## 2022-02-14 MED ORDER — METHOCARBAMOL 500 MG PO TABS: 500.0000 mg | ORAL_TABLET | Freq: Three times a day (TID) | ORAL | 0 refills | Status: DC | PRN

## 2022-02-14 NOTE — ED Provider Notes (Signed)
[facility]-EMERGENCY DEPT Provider Note   CSN: [id number] Arrival date & time: 02/14/22  1900     History  Chief Complaint  Patient presents with   Ankle Pain    Sean Page is a 36 y.o. male.   Ankle Pain Patient presents with pain after fall.  States he fell after stepping to avoid stepping on his cat.  Pain in right ankle right hip and low back.  Does have a history of chronic low back pain.  Has canes to use as needed but has not really had to use it.  No loss conscious.  Did not hit head.  Not on blood thinners.    Past Medical History:  Diagnosis Date   Glaucoma    Hypertension    Migraine     Home Medications Prior to Admission medications   Medication Sig Start Date End Date Taking? Authorizing Provider  methocarbamol (ROBAXIN) 500 MG tablet Take 1 tablet (500 mg total) by mouth every 8 (eight) hours as needed for muscle spasms. 02/14/22  Yes Benjiman Core, MD  albuterol (VENTOLIN HFA) 108 (90 Base) MCG/ACT inhaler Inhale 2 puffs into the lungs every 6 (six) hours as needed for wheezing or shortness of breath. 10/07/20   [provider]  amLODipine (NORVASC) 10 MG tablet Take 1 tablet by mouth once daily 11/16/21   Little Ishikawa, MD  aspirin EC 81 MG tablet Take 1 tablet (81 mg total) by mouth daily. Swallow whole. 12/18/20   Alver Sorrow, NP  atenolol (TENORMIN) 50 MG tablet Take 1 tablet by mouth once daily 09/08/21   Little Ishikawa, MD  losartan (COZAAR) 50 MG tablet Take 1 tablet by mouth once daily 02/04/22   Little Ishikawa, MD  rosuvastatin (CRESTOR) 10 MG tablet Take 1 tablet by mouth once daily 12/28/21   Little Ishikawa, MD      Allergies    Patient has no known allergies.    Review of Systems   Review of Systems  Physical Exam Updated Vital Signs BP (!) 131/92   Pulse 79   Temp 97.7 F (36.5 C) (Oral)   Resp 20   SpO2 95%  Physical Exam Vitals and nursing note  reviewed.  Constitutional:      Appearance: Normal appearance.  Cardiovascular:     Rate and Rhythm: Regular rhythm.  Abdominal:     Tenderness: There is no abdominal tenderness.  Musculoskeletal:        General: Tenderness present.     Cervical back: Neck supple.     Comments: Tenderness over lower lumbar spine.  No deformity.  Has some right hip tenderness mild pelvis tenderness.  Also mild right knee and right ankle tenderness with some swelling of the ankle.  Skin intact.  Skin:    Capillary Refill: Capillary refill takes less than 2 seconds.  Neurological:     Mental Status: He is oriented to person, place, and time.     ED Results / Procedures / Treatments   Labs (all labs ordered are listed, but only abnormal results are displayed) Labs Reviewed - No data to display  EKG None  Radiology CT Lumbar Spine Wo Contrast  Result Date: 02/14/2022 CLINICAL DATA:  Fall EXAM: CT LUMBAR SPINE WITHOUT CONTRAST TECHNIQUE: Multidetector CT imaging of the lumbar spine was performed without intravenous contrast administration. Multiplanar CT image reconstructions were also generated. RADIATION DOSE REDUCTION: This exam was performed according to the departmental dose-optimization program which includes  automated exposure control, adjustment of the mA and/or kV according to patient size and/or use of iterative reconstruction technique. COMPARISON:  None Available. FINDINGS: Segmentation: There are 6 non rib-bearing vertebral bodies and for the purposes of this dictation the upper most of these is considered T12. Alignment: Normal. Vertebrae: No acute fracture or focal pathologic process. Paraspinal and other soft tissues: Negative. Disc levels: No spinal canal stenosis or neural impingement. IMPRESSION: 1. No acute fracture or static subluxation of the lumbar spine. 2. Six non rib-bearing vertebral bodies with the uppermost considered T12. Electronically Signed   By: Deatra Robinson M.D.   On:  02/14/2022 21:15   DG Hip Unilat W or Wo Pelvis 2-3 Views Right  Result Date: 02/14/2022 CLINICAL DATA:  Fall yesterday.  Right hip pain. EXAM: DG HIP (WITH OR WITHOUT PELVIS) 2-3V RIGHT COMPARISON:  None Available. FINDINGS: There is no evidence of hip fracture or dislocation. There is no evidence of arthropathy or other focal bone abnormality. IMPRESSION: Negative. Electronically Signed   By: Danae Orleans M.D.   On: 02/14/2022 20:18   DG Knee Complete 4 Views Right  Result Date: 02/14/2022 CLINICAL DATA:  Injury, medial malleolus pain. EXAM: RIGHT KNEE - COMPLETE 4+ VIEW COMPARISON:  None Available. FINDINGS: No evidence of fracture, dislocation, or joint effusion. No evidence of arthropathy or other focal bone abnormality. Soft tissues are unremarkable. IMPRESSION: Negative. Electronically Signed   By: Darliss Cheney M.D.   On: 02/14/2022 19:53   DG Ankle Complete Right  Result Date: 02/14/2022 CLINICAL DATA:  Injury, medial malleolar pain. EXAM: RIGHT ANKLE - COMPLETE 3+ VIEW COMPARISON:  None Available. FINDINGS: There is no evidence of fracture, dislocation, or joint effusion. There is no evidence of arthropathy or other focal bone abnormality. There is diffuse soft tissue swelling of the ankle. IMPRESSION: 1. No acute fracture or dislocation. 2. Diffuse soft tissue swelling of the ankle. Electronically Signed   By: Darliss Cheney M.D.   On: 02/14/2022 19:52    Procedures Procedures    Medications Ordered in ED Medications - No data to display  ED Course/ Medical Decision Making/ A&P                           Medical Decision Making Amount and/or Complexity of Data Reviewed Radiology: ordered.  Risk Prescription drug management.   Patient with fall.  Mechanical.  Stepped to avoid cat.  Pain in ankle knee hip and back.  X-ray imaging of the hip knee and ankle reassuring.  No fracture seen.  We will get CT scan lumbar spine to evaluate for other injury.  I do not think we need  chest head abdomen or neck imaging.  CT scan and x-rays reassuring.  No fracture seen.  Will treat symptomatically with muscle laxer's and have patient follow-up as an outpatient.        Final Clinical Impression(s) / ED Diagnoses Final diagnoses:  Sprain of right ankle, unspecified ligament, initial encounter  Fall, initial encounter  Lumbar strain, initial encounter    Rx / DC Orders ED Discharge Orders          Ordered    methocarbamol (ROBAXIN) 500 MG tablet  Every 8 hours PRN        02/14/22 2148              Benjiman Core, MD 02/14/22 2322

## 2022-02-14 NOTE — ED Triage Notes (Addendum)
Patient arrived with complaints of right ankle and right knee pain after a fall yesterday. States he did not hit his head. No relief from OTC or his prescription medication.

## 2022-02-28 ENCOUNTER — Other Ambulatory Visit: Payer: Self-pay | Admitting: Cardiology

## 2022-02-28 DIAGNOSIS — I10 Essential (primary) hypertension: Secondary | ICD-10-CM

## 2022-04-03 ENCOUNTER — Other Ambulatory Visit: Payer: Self-pay | Admitting: Cardiology

## 2022-04-03 DIAGNOSIS — I10 Essential (primary) hypertension: Secondary | ICD-10-CM

## 2022-05-11 ENCOUNTER — Ambulatory Visit: Payer: 59 | Admitting: Cardiovascular Disease

## 2022-06-15 ENCOUNTER — Other Ambulatory Visit: Payer: Self-pay | Admitting: Cardiology

## 2022-07-13 ENCOUNTER — Emergency Department (HOSPITAL_COMMUNITY)
Admission: EM | Admit: 2022-07-13 | Discharge: 2022-07-13 | Disposition: A | Discharge status: Home / Self Care | Payer: No Typology Code available for payment source | Attending: Emergency Medicine | Admitting: Emergency Medicine

## 2022-07-13 ENCOUNTER — Other Ambulatory Visit: Payer: Self-pay

## 2022-07-13 ENCOUNTER — Encounter (HOSPITAL_COMMUNITY): Payer: Self-pay

## 2022-07-13 DIAGNOSIS — Z7982 Long term (current) use of aspirin: Secondary | ICD-10-CM | POA: Insufficient documentation

## 2022-07-13 DIAGNOSIS — M5431 Sciatica, right side: Secondary | ICD-10-CM | POA: Insufficient documentation

## 2022-07-13 DIAGNOSIS — M545 Low back pain, unspecified: Secondary | ICD-10-CM | POA: Diagnosis present

## 2022-07-13 MED ORDER — PREDNISONE 10 MG PO TABS: ORAL_TABLET | ORAL | 0 refills | Status: DC

## 2022-07-13 MED ORDER — METHYLPREDNISOLONE SODIUM SUCC 125 MG IJ SOLR
125.0000 mg | Freq: Once | INTRAMUSCULAR | Status: AC
  Administered 2022-07-13: 125 mg via INTRAMUSCULAR
  Filled 2022-07-13: qty 2

## 2022-07-13 MED ORDER — HYDROCODONE-ACETAMINOPHEN 5-325 MG PO TABS: 1.0000 | ORAL_TABLET | ORAL | 0 refills | Status: DC | PRN

## 2022-07-13 MED ORDER — HYDROMORPHONE HCL 1 MG/ML IJ SOLN
1.0000 mg | Freq: Once | INTRAMUSCULAR | Status: AC
  Administered 2022-07-13: 1 mg via INTRAMUSCULAR
  Filled 2022-07-13: qty 1

## 2022-07-13 MED ORDER — KETOROLAC TROMETHAMINE 30 MG/ML IJ SOLN
30.0000 mg | Freq: Once | INTRAMUSCULAR | Status: AC
  Administered 2022-07-13: 30 mg via INTRAMUSCULAR
  Filled 2022-07-13: qty 1

## 2022-07-13 NOTE — ED Provider Notes (Signed)
[place] EMERGENCY DEPARTMENT AT [facility] Provider Note   CSN: [id number] Arrival date & time: 07/13/22  1115     History  Chief Complaint  Patient presents with   Back Pain    Sean Page is a 37 y.o. male.  Patient complains of pain in his right low back and down his right leg after busting concrete and metal 4 days ago.  Patient reports he has had a history of sciatica in the past patient reports he feels like he has been shot in his right butt and pain goes down his leg.  Patient denies any numbness in his feet he denies any weakness patient complains of pain with walking.  The history is provided by the patient. No language interpreter was used.  Back Pain Location:  Lumbar spine Quality:  Aching Radiates to:  Does not radiate Pain severity:  Severe Pain is:  Same all the time Duration:  4 days      Home Medications Prior to Admission medications   Medication Sig Start Date End Date Taking? Authorizing Provider  HYDROcodone-acetaminophen (NORCO/VICODIN) 5-325 MG tablet Take 1 tablet by mouth every 4 (four) hours as needed for moderate pain. 07/13/22 07/13/23 Yes Cheron Schaumann K, PA-C  predniSONE (DELTASONE) 10 MG tablet 6,5,4,3,2,1 taper 07/13/22  Yes Elson Areas, PA-C  albuterol (VENTOLIN HFA) 108 (90 Base) MCG/ACT inhaler Inhale 2 puffs into the lungs every 6 (six) hours as needed for wheezing or shortness of breath. 10/07/20   [provider]  amLODipine (NORVASC) 10 MG tablet Take 1 tablet by mouth once daily 11/16/21   Little Ishikawa, MD  aspirin EC 81 MG tablet Take 1 tablet (81 mg total) by mouth daily. Swallow whole. 12/18/20   Alver Sorrow, NP  atenolol (TENORMIN) 50 MG tablet Take 1 tablet by mouth once daily 09/08/21   Little Ishikawa, MD  losartan (COZAAR) 50 MG tablet Take 1 tablet by mouth once daily 04/05/22   Little Ishikawa, MD  methocarbamol (ROBAXIN) 500 MG tablet Take 1 tablet (500 mg total) by  mouth every 8 (eight) hours as needed for muscle spasms. 02/14/22   Benjiman Core, MD  rosuvastatin (CRESTOR) 10 MG tablet Take 1 tablet by mouth once daily 06/15/22   Little Ishikawa, MD      Allergies    Patient has no known allergies.    Review of Systems   Review of Systems  Musculoskeletal:  Positive for back pain.  All other systems reviewed and are negative.   Physical Exam Updated Vital Signs BP (!) 150/96   Pulse 84   Temp 98.5 F (36.9 C)   Resp 20   Wt 128 kg   SpO2 95%   BMI 41.67 kg/m  Physical Exam Vitals and nursing note reviewed.  Constitutional:      Appearance: He is well-developed.  HENT:     Head: Normocephalic.  Cardiovascular:     Rate and Rhythm: Normal rate.  Pulmonary:     Effort: Pulmonary effort is normal.  Abdominal:     General: Abdomen is flat. There is no distension.  Musculoskeletal:        General: Normal range of motion.     Cervical back: Normal range of motion.     Comments: Normal reflexes full range of motion hips nontender  Skin:    General: Skin is warm.  Neurological:     General: No focal deficit present.     Mental  Status: He is alert and oriented to person, place, and time.  Psychiatric:        Mood and Affect: Mood normal.     ED Results / Procedures / Treatments   Labs (all labs ordered are listed, but only abnormal results are displayed) Labs Reviewed - No data to display  EKG None  Radiology No results found.  Procedures Procedures    Medications Ordered in ED Medications  methylPREDNISolone sodium succinate (SOLU-MEDROL) 125 mg/2 mL injection 125 mg (125 mg Intramuscular Given 07/13/22 1203)  ketorolac (TORADOL) 30 MG/ML injection 30 mg (30 mg Intramuscular Given 07/13/22 1203)  HYDROmorphone (DILAUDID) injection 1 mg (1 mg Intramuscular Given 07/13/22 1203)    ED Course/ Medical Decision Making/ A&P                             Medical Decision Making Complains of pain in his buttock  that goes down his leg.  Patient has had sciatica in the past  Risk Prescription drug management. Risk Details: And is given an injection of Solu-Medrol Toradol and Dilaudid.  Patient reports some relief from discomfort I counseled him on his symptoms he is advised to follow-up with Dr. Eulah Pont orthopedist on call patient is advised if he continues to have pain he may need further evaluation and possible imaging patient is given a prescription for prednisone for a 6-day Dosepak and hydrocodone for discomfort.           Final Clinical Impression(s) / ED Diagnoses Final diagnoses:  Sciatica of right side    Rx / DC Orders ED Discharge Orders          Ordered    predniSONE (DELTASONE) 10 MG tablet        07/13/22 1220    HYDROcodone-acetaminophen (NORCO/VICODIN) 5-325 MG tablet  Every 4 hours PRN        07/13/22 1220           An After Visit Summary was printed and given to the patient.    Elson Areas, PA-C 07/13/22 1359    Melene Plan, DO 07/13/22 1415

## 2022-07-13 NOTE — ED Triage Notes (Signed)
BIBA with c/o right lower back pain with pain radiating down right leg x4 days after busting concrete up.  Denies numbness/tingling Denies incontinence of urine and bowel.

## 2022-08-10 ENCOUNTER — Other Ambulatory Visit: Payer: Self-pay | Admitting: Cardiology

## 2022-08-10 DIAGNOSIS — Z8669 Personal history of other diseases of the nervous system and sense organs: Secondary | ICD-10-CM | POA: Insufficient documentation

## 2022-09-05 ENCOUNTER — Other Ambulatory Visit: Payer: Self-pay | Admitting: Cardiology

## 2022-09-09 ENCOUNTER — Other Ambulatory Visit: Payer: Self-pay | Admitting: Cardiology

## 2022-09-10 IMAGING — CR DG CHEST 2V
2 series · 2 of 2 positions shown · non-contrast
Comparison: February 10, 2011.

CLINICAL DATA: Chest pain.

EXAM:
CHEST - 2 VIEW

[w chest pa]
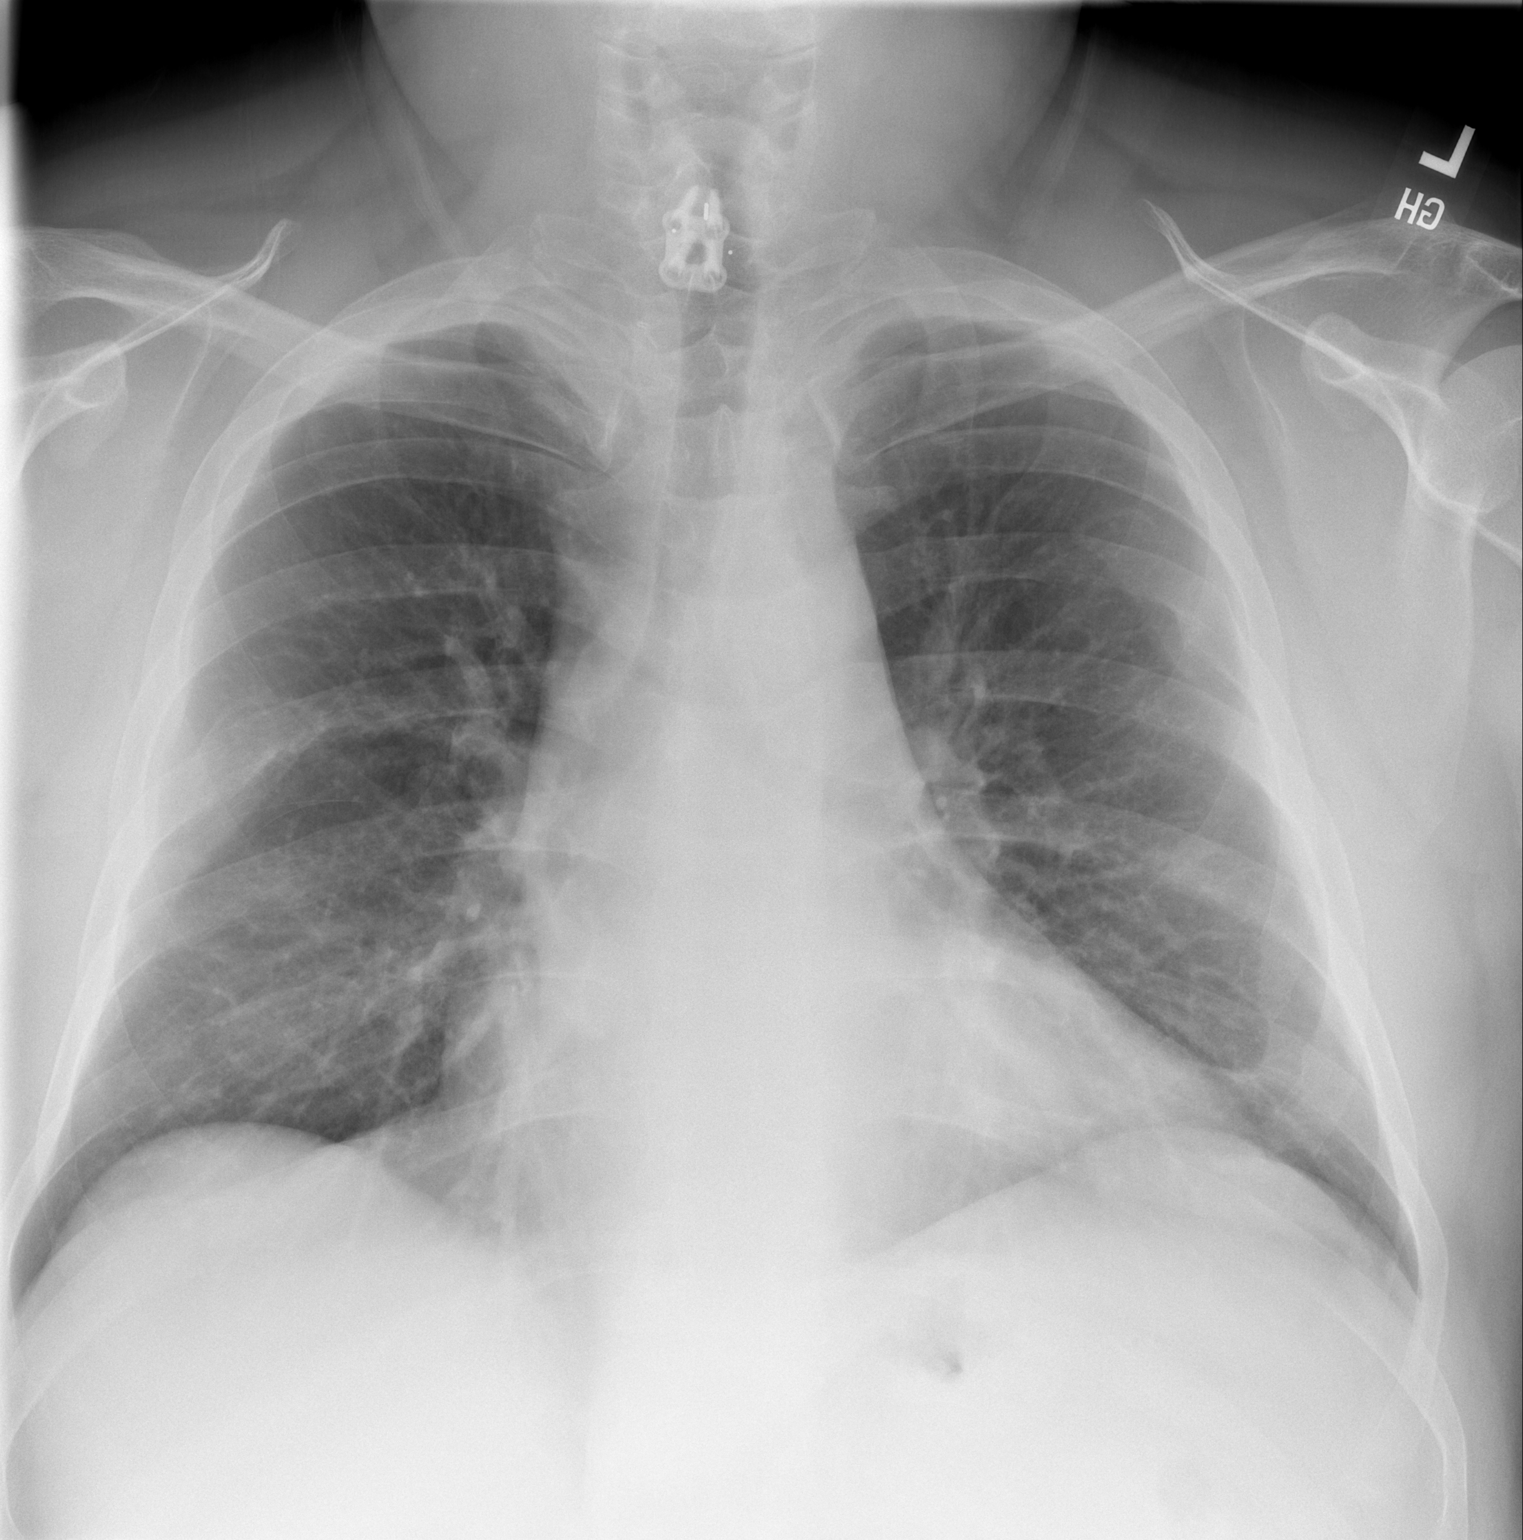

[w chest lat]
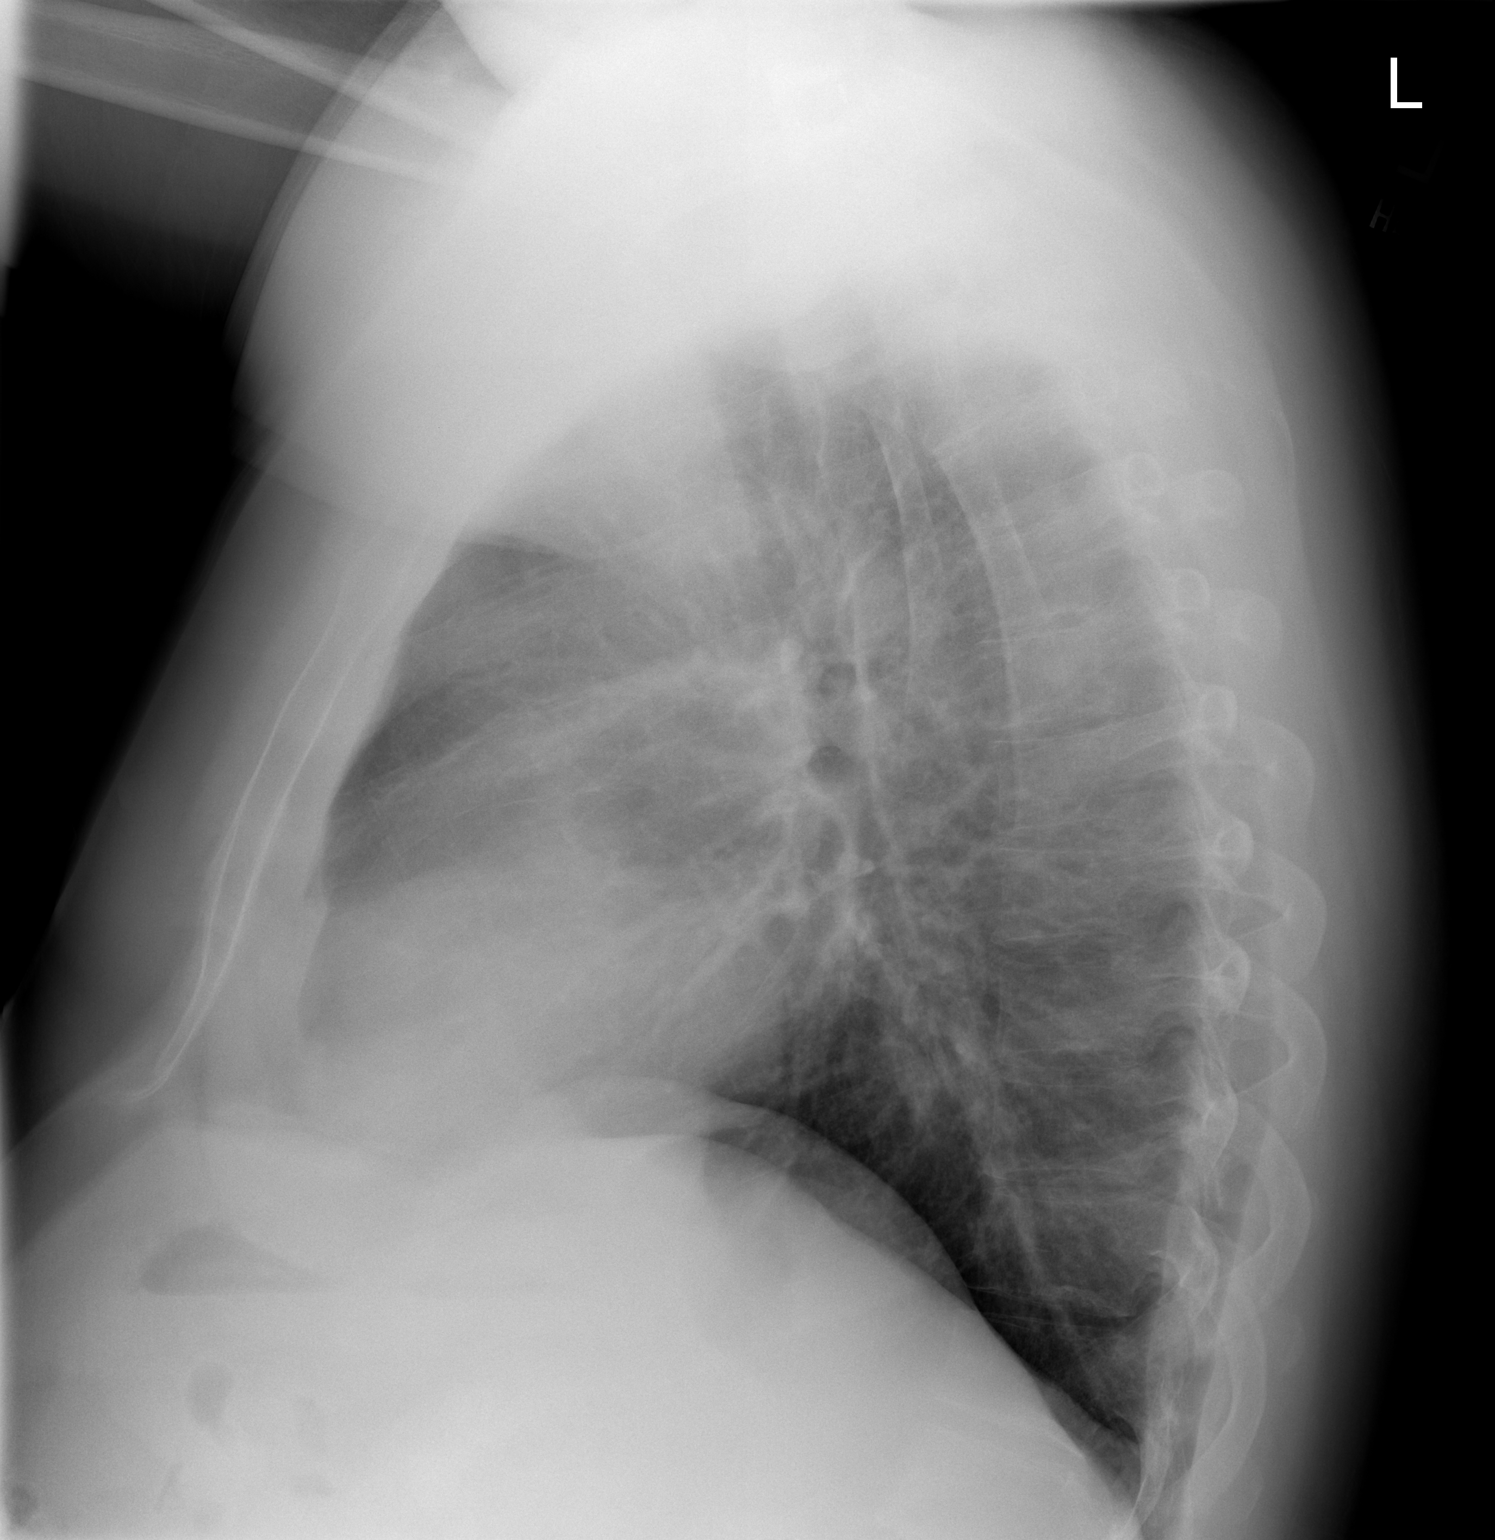

[2 of 2 positions shown; findings below may reference images not displayed]

FINDINGS: The heart size and mediastinal contours are within normal limits.
Both lungs are clear. Old bilateral rib fractures are noted.
IMPRESSION: No active cardiopulmonary disease.

## 2022-09-29 ENCOUNTER — Other Ambulatory Visit: Payer: Self-pay | Admitting: Cardiology

## 2022-09-29 MED ORDER — ATENOLOL 50 MG PO TABS: 50.0000 mg | ORAL_TABLET | Freq: Every day | ORAL | 0 refills | Status: DC

## 2022-09-29 MED ORDER — ROSUVASTATIN CALCIUM 10 MG PO TABS: 10.0000 mg | ORAL_TABLET | Freq: Every day | ORAL | 0 refills | Status: DC

## 2022-10-18 ENCOUNTER — Encounter (HOSPITAL_BASED_OUTPATIENT_CLINIC_OR_DEPARTMENT_OTHER): Payer: Self-pay | Admitting: Family

## 2022-10-18 ENCOUNTER — Ambulatory Visit (HOSPITAL_BASED_OUTPATIENT_CLINIC_OR_DEPARTMENT_OTHER): Payer: 59 | Admitting: Family

## 2022-10-18 VITALS — BP 130/94 | HR 79 | Ht 69.0 in | Wt 305.3 lb

## 2022-10-18 DIAGNOSIS — G4733 Obstructive sleep apnea (adult) (pediatric): Secondary | ICD-10-CM

## 2022-10-18 DIAGNOSIS — I2510 Atherosclerotic heart disease of native coronary artery without angina pectoris: Secondary | ICD-10-CM

## 2022-10-18 DIAGNOSIS — I10 Essential (primary) hypertension: Secondary | ICD-10-CM

## 2022-10-18 DIAGNOSIS — Z6841 Body Mass Index (BMI) 40.0 and over, adult: Secondary | ICD-10-CM

## 2022-10-18 DIAGNOSIS — Z720 Tobacco use: Secondary | ICD-10-CM | POA: Diagnosis not present

## 2022-10-18 DIAGNOSIS — E785 Hyperlipidemia, unspecified: Secondary | ICD-10-CM | POA: Diagnosis not present

## 2022-10-18 MED ORDER — ATENOLOL 50 MG PO TABS: 50.0000 mg | ORAL_TABLET | Freq: Every day | ORAL | 1 refills | Status: DC

## 2022-10-18 MED ORDER — ROSUVASTATIN CALCIUM 10 MG PO TABS: 10.0000 mg | ORAL_TABLET | Freq: Every day | ORAL | 1 refills | Status: DC

## 2022-10-18 NOTE — Progress Notes (Signed)
Cardiology Office Note:  .   Date:  10/24/2022  ID:  FRANKI STEMEN, DOB [birth year], MRN [id number] PCP: Dennie Maizes, NP (Inactive)  [facility] Providers Cardiologist:  Little Ishikawa, MD Sleep Medicine:  Nicki Guadalajara, MD    History of Present Illness: .   Rayshun Kandler Deniston is a 37 y.o. male with a hx of migraines, hypertension, tobacco use, alcohol use, corneal transplant at [facility] .   He was initially seen 01/2019 for tachycardia at which time Atenolol was increased to 50mg  daily. TTE was ordered given dyspnea on exertion with no significant abnormalities. ZIO patch worn for 3 days with no significant arrhythmias.    He was seen 08/14/20 by Dr. Bjorn Pippin. Noted chest pain , as it was nonexertional likely musculoskeletal, ischemic workup as deferred.   Seen in follow up 11/14/20 with persistent chest pain and dyspnea. Echo 12/05/20 LVEF 60-65%, no RWMA, no valvular abnormalities. Cardiac CTA with calcium score of 1. Rosuvastatin 10mg  QD was initiated.    Last seen 08/17/21 with BP at goal and no changes made.   Presents today for follow up independently. Had back surgery 6 weeks ago with Dr. Marikay Alar. Helps take care for his mom. Two weeks ago had a shooting pain in hius left chest to armpit which he attributed to gas, no exertional component. As he has been on FMLA after surgery, drinking and smoking more. Smoking 2.5 PPD - prior when at work around 1 PPD. Has not checked blood pressure in several months   ROS: Please see the history of present illness.    All other systems reviewed and are negative.   Studies Reviewed: Marland Kitchen   EKG Interpretation Date/Time:  Monday October 18 2022 09:59:30 EDT Ventricular Rate:  79 PR Interval:  132 QRS Duration:  90 QT Interval:  360 QTC Calculation: 412 R Axis:   64  Text Interpretation: Normal sinus rhythm No acute changes Confirmed by Gillian Shields ([id number]) on 10/18/2022 10:27:16 AM    Cardiac Studies & Procedures        ECHOCARDIOGRAM  ECHOCARDIOGRAM COMPLETE 12/05/2020  Narrative ECHOCARDIOGRAM REPORT    Patient Name:   RUBY DILONE Date of Exam: 12/05/2020 Medical Rec #:  [id number]          Height:       70.0 in Accession #:    [id number]         Weight:       266.8 lb Date of Birth:  [birth year]          BSA:          2.359 m Patient Age:    35 years           BP:           118/86 mmHg Patient Gender: M                  HR:           84 bpm. Exam Location:  [address]  Procedure: 2D Echo, 3D Echo, Cardiac Doppler and Color Doppler  Indications:    R06.00 Dysnea  History:        Patient has prior history of Echocardiogram examinations, most recent 03/13/2019. Signs/Symptoms:Dyspnea, Chest Pain and Edema; Risk Factors:Hypertension and Current Smoker.  Sonographer:    Farrel Conners RDCS Referring Phys: [phone] Georg Ang S Jguadalupe Opiela  IMPRESSIONS   1. Left ventricular ejection fraction, by estimation, is 60 to 65%. Left ventricular ejection fraction by  3D volume is 60 %. The left ventricle has normal function. The left ventricle has no regional wall motion abnormalities. Left ventricular diastolic parameters were normal. 2. Right ventricular systolic function is normal. The right ventricular size is normal. There is normal pulmonary artery systolic pressure. The estimated right ventricular systolic pressure is 22.5 mmHg. 3. The mitral valve is grossly normal. Trivial mitral valve regurgitation. 4. The aortic valve is tricuspid. Aortic valve regurgitation is not visualized. 5. The inferior vena cava is normal in size with greater than 50% respiratory variability, suggesting right atrial pressure of 3 mmHg.  Comparison(s): Changes from prior study are noted. 03/13/2019: LVEF 55-60%.  Conclusion(s)/Recommendation(s): Consider non-cardiac causes of dyspnea.  FINDINGS Left Ventricle: Left ventricular ejection fraction, by estimation, is 60 to 65%. Left ventricular ejection fraction by 3D  volume is 60 %. The left ventricle has normal function. The left ventricle has no regional wall motion abnormalities. The left ventricular internal cavity size was normal in size. There is no left ventricular hypertrophy. Left ventricular diastolic parameters were normal.  Right Ventricle: The right ventricular size is normal. No increase in right ventricular wall thickness. Right ventricular systolic function is normal. There is normal pulmonary artery systolic pressure. The tricuspid regurgitant velocity is 2.21 m/s, and with an assumed right atrial pressure of 3 mmHg, the estimated right ventricular systolic pressure is 22.5 mmHg.  Left Atrium: Left atrial size was normal in size.  Right Atrium: Right atrial size was normal in size.  Pericardium: There is no evidence of pericardial effusion.  Mitral Valve: The mitral valve is grossly normal. Trivial mitral valve regurgitation.  Tricuspid Valve: The tricuspid valve is grossly normal. Tricuspid valve regurgitation is trivial.  Aortic Valve: The aortic valve is tricuspid. Aortic valve regurgitation is not visualized.  Pulmonic Valve: The pulmonic valve was normal in structure. Pulmonic valve regurgitation is not visualized.  Aorta: The aortic root and ascending aorta are structurally normal, with no evidence of dilitation.  Venous: The inferior vena cava is normal in size with greater than 50% respiratory variability, suggesting right atrial pressure of 3 mmHg.  IAS/Shunts: No atrial level shunt detected by color flow Doppler.   LEFT VENTRICLE PLAX 2D LVIDd:         4.80 cm         Diastology LVIDs:         3.10 cm         LV e' medial:    12.05 cm/s LV PW:         0.80 cm         LV E/e' medial:  10.7 LV IVS:        0.60 cm         LV e' lateral:   14.65 cm/s LVOT diam:     2.40 cm         LV E/e' lateral: 8.8 LV SV:         88 LV SV Index:   37 LVOT Area:     4.52 cm        3D Volume EF LV 3D EF:     Left ventricul ar ejection fraction by 3D volume is 60 %.  3D Volume EF: 3D EF:        60 % LV EDV:       177 ml LV ESV:       71 ml LV SV:        106 ml  LEFT ATRIUM  Index       RIGHT ATRIUM           Index LA diam:        4.00 cm 1.70 cm/m  RA Area:     22.10 cm LA Vol (A2C):   57.4 ml 24.33 ml/m RA Volume:   70.50 ml  29.88 ml/m LA Vol (A4C):   52.8 ml 22.38 ml/m LA Biplane Vol: 57.9 ml 24.54 ml/m AORTIC VALVE LVOT Vmax:   106.50 cm/s LVOT Vmean:  67.400 cm/s LVOT VTI:    0.194 m  AORTA Ao Root diam: 3.30 cm Ao Asc diam:  3.15 cm  MITRAL VALVE                TRICUSPID VALVE MV Area (PHT): cm          TR Peak grad:   19.5 mmHg MV Decel Time: 151 msec     TR Vmax:        221.00 cm/s MV E velocity: 128.73 cm/s MV A velocity: 56.60 cm/s   SHUNTS MV E/A ratio:  2.27         Systemic VTI:  0.19 m Systemic Diam: 2.40 cm  Zoila Shutter MD Electronically signed by Zoila Shutter MD Signature Date/Time: 12/05/2020/12:42:14 PM    Final    MONITORS  LONG TERM MONITOR (3-14 DAYS) 03/07/2019  Narrative  No significant arrhythmias  3 days of data recorded on Zio monitor. Patient had a min HR of 46 bpm, max HR of 132 bpm, and avg HR of 92 bpm. Predominant underlying rhythm was Sinus Rhythm. No VT, SVT, atrial fibrillation, high degree block, or pauses noted. Isolated atrial and ventricular ectopy was rare (<1%). There were no triggered events. No significant arrhythmias detected.   CT SCANS  CT CORONARY MORPH W/CTA COR W/SCORE 11/28/2020  Addendum 11/28/2020  7:01 PM ADDENDUM REPORT: 11/28/2020 18:59  HISTORY: Chest pain/anginal equiv, 23yr CHD risk 10-20%, not treadmill candidate  EXAM: Cardiac/Coronary  CT  TECHNIQUE: The patient was scanned on a [employer].  PROTOCOL: A 120 kV prospective scan was triggered in the descending thoracic aorta at 111 HU's. Axial non-contrast 3 mm slices were carried out through the heart. The data set  was analyzed on a dedicated work station and scored using the Agatston method. Gantry rotation speed was 250 msecs and collimation was .6 mm. Beta blockade and 0.8 mg of sl NTG was given. The 3D data set was reconstructed in 5% intervals of the 35-75 % of the R-R cycle. Systolic and diastolic phases were analyzed on a dedicated work station using MPR, MIP and VRT modes. The patient received OMNIPAQUE IOHEXOL 350 MG/ML SOLN contrast.  FINDINGS: Image quality: Average  Noise artifact is: Moderate misregistration and decreased signal to noise ratio due to body habitus.  Coronary calcium score is 1. Calcium scoring not validated for patients <45 yo. When compared to white males age 74, the patient in the 75th percentile for age and sex matched control.  Coronary arteries: Normal coronary origins.  Right dominance.  Right Coronary Artery: Minimal mixed atherosclerotic plaque in the proximal RCA, <25% stenosis.  Left Main Coronary Artery: No detectable plaque or stenosis.  Left Anterior Descending Coronary Artery: Minimal mixed atherosclerotic plaque in the proximal LAD, <25% stenosis.  Left Circumflex Artery: No detectable plaque or stenosis.  Aorta: Normal size, 31 mm at the mid ascending aorta (level of the PA bifurcation) measured double oblique. No calcifications. No dissection.  Aortic Valve: No  calcifications.  Other findings:  Normal pulmonary vein drainage into the left atrium.  Normal left atrial appendage without a thrombus.  Mild dilation of main pulmonary artery, 31 mm, may suggest increased pulmonary pressure.  IMPRESSION: 1. Minimal CAD, CADRADS = 1.  2. Coronary calcium score of 1. Calcium scoring not validated for patients <45 yo. When compared to white males age 65, the patient in the 75th percentile for age and sex matched control.  3. Normal coronary origin with right dominance.   Electronically Signed By: Weston Brass M.D. On:  11/28/2020 18:59  Narrative EXAM: OVER-READ INTERPRETATION  CT CHEST  The following report is an over-read performed by radiologist Dr. Trudie Reed of [facility] on 11/28/2020. This over-read does not include interpretation of cardiac or coronary anatomy or pathology. The coronary calcium score/coronary CTA interpretation by the cardiologist is attached.  COMPARISON:  None.  FINDINGS: Atherosclerotic calcifications in the thoracic aorta. Within the visualized portions of the thorax there are no suspicious appearing pulmonary nodules or masses, there is no acute consolidative airspace disease, no pleural effusions, no pneumothorax and no lymphadenopathy. Visualized portions of the upper abdomen are unremarkable. There are no aggressive appearing lytic or blastic lesions noted in the visualized portions of the skeleton. Multiple old healed bilateral rib fractures.  IMPRESSION: 1.  Aortic Atherosclerosis (ICD10-I70.0).  Electronically Signed: By: Trudie Reed M.D. On: 11/28/2020 15:30          Risk Assessment/Calculations:            Physical Exam:   VS:  BP (!) 130/94   Pulse 79   Ht 5\' 9"  (1.753 m)   Wt (!) 305 lb 4.8 oz (138.5 kg)   BMI 45.08 kg/m    Wt Readings from Last 3 Encounters:  10/18/22 (!) 305 lb 4.8 oz (138.5 kg)  07/13/22 282 lb 3 oz (128 kg)  12/28/21 283 lb 3.2 oz (128.5 kg)    GEN: Well nourished, overweight, well developed in no acute distress NECK: No JVD; No carotid bruits CARDIAC: RRR, no murmurs, rubs, gallops RESPIRATORY:  Clear to auscultation without rales, wheezing or rhonchi  ABDOMEN: Soft, non-tender, non-distended EXTREMITIES:  No edema; No deformity   ASSESSMENT AND PLAN: .    Nonobstructive CAD / HLD, LDL goal <70 - Calcium score 11/2020 with calcium score of 1. Continue Crestor 10mg  every day, Asprin 81mg  QD. Heart healthy diet and regular cardiovascular exercise encouraged.  Smoking cessation encouraged.   Update CMP, CBC, lipid panel today.    Alcohol and tobacco use - Alcohol cessation encouraged. Notes drinking more after recent surgery as has been out of owrk. Smoking cessation encouraged. Smoking 2.5 PPD - not interested in quitting at this time. Recommend utilization of 1800QUITNOW. Not interested in quitting at this time.   HTN - BP not at goal <130/80. Discussed to monitor BP at home at least 2 hours after medications and sitting for 5-10 minutes. Update CMP today. Continue Amlodipine 10mg  every day, Atenolol 50mg  every day, losartan 50mg  every day. Check in via MyChart in 2 weeks, if BP not at goal plan to increase losartan dose vs transition to Amlodipine-Olmesartan.   Obesity - A1c today. Weight loss via diet and exercise encouraged. Discussed the impact being overweight would have on cardiovascular risk.    Tachycardia - No recurrence. Continue current dose Atenolol  OSA - Follows with Dr. Tresa Endo. CPAP compliance encouraged. Needs follow up with sleep team to discuss as not using routinely.  Dispo: follow up in 3 mos  Signed, Alver Sorrow, NP

## 2022-10-18 NOTE — Patient Instructions (Signed)
Medication Instructions:  Your physician recommends that you continue on your current medications as directed. Please refer to the Current Medication list given to you today.  *If you need a refill on your cardiac medications before your next appointment, please call your pharmacy*   Lab Work: Your physician recommends that you return for lab work today- A1C, Lipid Panel, Cmp, CBC If you have labs (blood work) drawn today and your tests are completely normal, you will receive your results only by: MyChart Message (if you have MyChart) OR A paper copy in the mail If you have any lab test that is abnormal or we need to change your treatment, we will call you to review the results.  Follow-Up: At [facility], you and your health needs are our priority.  As part of our continuing mission to provide you with exceptional heart care, we have created designated Provider Care Teams.  These Care Teams include your primary Cardiologist (physician) and Advanced Practice Providers (APPs -  Physician Assistants and Nurse Practitioners) who all work together to provide you with the care you need, when you need it.  We recommend signing up for the patient portal called "MyChart".  Sign up information is provided on this After Visit Summary.  MyChart is used to connect with patients for Virtual Visits (Telemedicine).  Patients are able to view lab/test results, encounter notes, upcoming appointments, etc.  Non-urgent messages can be sent to your provider as well.   To learn more about what you can do with MyChart, go to [email or web address].    Your next appointment:   3 months with Dr. Bjorn Pippin or APP

## 2022-10-19 ENCOUNTER — Telehealth (HOSPITAL_BASED_OUTPATIENT_CLINIC_OR_DEPARTMENT_OTHER): Payer: Self-pay

## 2022-10-19 DIAGNOSIS — E785 Hyperlipidemia, unspecified: Secondary | ICD-10-CM

## 2022-10-19 DIAGNOSIS — I2510 Atherosclerotic heart disease of native coronary artery without angina pectoris: Secondary | ICD-10-CM

## 2022-10-19 LAB — COMPREHENSIVE METABOLIC PANEL
ALT: 26 IU/L (ref 0–44)
AST: 14 IU/L (ref 0–40)
Albumin: 4.3 g/dL (ref 4.1–5.1)
Alkaline Phosphatase: 117 IU/L (ref 44–121)
BUN/Creatinine Ratio: 24 — ABNORMAL HIGH (ref 9–20)
BUN: 22 mg/dL — ABNORMAL HIGH (ref 6–20)
Bilirubin Total: 0.5 mg/dL (ref 0.0–1.2)
CO2: 22 mmol/L (ref 20–29)
Calcium: 9.6 mg/dL (ref 8.7–10.2)
Chloride: 106 mmol/L (ref 96–106)
Creatinine, Ser: 0.9 mg/dL (ref 0.76–1.27)
Globulin, Total: 2.7 g/dL (ref 1.5–4.5)
Glucose: 103 mg/dL — ABNORMAL HIGH (ref 70–99)
Potassium: 4.6 mmol/L (ref 3.5–5.2)
Sodium: 143 mmol/L (ref 134–144)
Total Protein: 7 g/dL (ref 6.0–8.5)
eGFR: 112 mL/min/{1.73_m2} (ref >59)

## 2022-10-19 LAB — CBC
Hematocrit: 47.2 % (ref 37.5–51.0)
Hemoglobin: 15.5 g/dL (ref 13.0–17.7)
MCH: 30.9 pg (ref 26.6–33.0)
MCHC: 32.8 g/dL (ref 31.5–35.7)
MCV: 94 fL (ref 79–97)
Platelets: 262 10*3/uL (ref 150–450)
RBC: 5.01 x10E6/uL (ref 4.14–5.80)
RDW: 13.7 % (ref 11.6–15.4)
WBC: 12.5 10*3/uL — ABNORMAL HIGH (ref 3.4–10.8)

## 2022-10-19 LAB — LIPID PANEL
Chol/HDL Ratio: 2.8 ratio (ref 0.0–5.0)
Cholesterol, Total: 161 mg/dL (ref 100–199)
HDL: 57 mg/dL (ref >39)
LDL Chol Calc (NIH): 82 mg/dL (ref 0–99)
Triglycerides: 127 mg/dL (ref 0–149)
VLDL Cholesterol Cal: 22 mg/dL (ref 5–40)

## 2022-10-19 LAB — HEMOGLOBIN A1C
Est. average glucose Bld gHb Est-mCnc: 123 mg/dL
Hgb A1c MFr Bld: 5.9 % — ABNORMAL HIGH (ref 4.8–5.6)

## 2022-10-19 MED ORDER — ROSUVASTATIN CALCIUM 20 MG PO TABS: 20.0000 mg | ORAL_TABLET | Freq: Every day | ORAL | 3 refills | Status: DC

## 2022-10-19 NOTE — Telephone Encounter (Addendum)
Results called to patient who verbalizes understanding! Labs ordered and mailed, prescriptions updated and sent to pharmacy on file.    ----- Message from Alver Sorrow sent at 10/19/2022  9:42 AM EDT ----- Stable kidney function. Normal electrolytes, liver function. CBC stable from previous. A1c reveals prediabetes - recommend aiming for 150 minutes of moderate intensity activity per week, reducing alcohol intake, following a low carbohydrate diet. LDL (bad cholesterol) not at goal of less than 70 - increase Rosuvastatin to 20mg  daily with FLP/LFT in 2 months.

## 2022-11-01 ENCOUNTER — Encounter (HOSPITAL_BASED_OUTPATIENT_CLINIC_OR_DEPARTMENT_OTHER): Payer: Self-pay

## 2022-11-02 ENCOUNTER — Other Ambulatory Visit: Payer: Self-pay | Admitting: Cardiology

## 2022-11-02 DIAGNOSIS — I10 Essential (primary) hypertension: Secondary | ICD-10-CM

## 2023-02-06 NOTE — Progress Notes (Unsigned)
Cardiology Office Note:    Date:  02/07/2023   ID:  GLEEN NORTZ, DOB 06-03-1985, MRN [id number]  PCP:  Dennie Maizes, NP (Inactive)  Cardiologist:  Little Ishikawa, MD  Electrophysiologist:  None   Referring MD: No ref. provider found   Chief Complaint  Patient presents with   Shortness of Breath    History of Present Illness:    ARIE DANKERS is a 37 y.o. male with a hx of migraines, hypertension, tobacco use, alcohol use presents for a follow-up visit.  He was initially seen on 02/01/2019 for an evaluation of tachycardia.  He denies any palpitations or chest pain.  Does report some dyspnea on exertion.  Has also noted lower extremity edema.  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  Since that time reports he has not been working and has been smoking and drinking more.  He was smoking 1.5 to 2 packs/day.  Has smoked for 20 years.  States that he was drinking whiskey, drinks over a pint per day.  Started on atenolol 25 mg daily a week prior for hypertension.    At initial clinic visit, atenolol was increased to 50 mg daily.  TTE was ordered given his dyspnea on exertion, which showed no significant abnormalities.  Given his tachycardia, a Zio patch x3 days was ordered, which showed no significant arrhythmias.  Coronary CTA on 11/28/21 showed minimal CAD (calcium score 1).  Echo 12/05/21 showed normal biventricular function, no significant valvular disease.  Since last clinic visit, he reports he is doing okay.  Reports occasional left-sided chest pain on far left side of chest, usually at night.  Had to have surgery on back in June.  States that he has been feeling more short of breath.  Has not been working due to his back pain.  He is now doing physical therapy twice per week.  Denies any lightheadedness or syncope.  Reports occasional palpitations but just last for few seconds.  He is smoking 2 packs/day.  Reports he is drinking 6 beers per  day and also doing 1 shot of whiskey per day.  BP Readings from Last 3 Encounters:  02/07/23 (!) 134/100  10/18/22 (!) 130/94  07/13/22 (!) 150/96     Past Medical History:  Diagnosis Date   Glaucoma    Hypertension    Migraine     History reviewed. No pertinent surgical history.  Current Medications: Current Meds  Medication Sig   albuterol (VENTOLIN HFA) 108 (90 Base) MCG/ACT inhaler Inhale 2 puffs into the lungs every 6 (six) hours as needed for wheezing or shortness of breath.   amLODipine (NORVASC) 10 MG tablet Take 1 tablet by mouth once daily   aspirin EC 81 MG tablet Take 1 tablet (81 mg total) by mouth daily. Swallow whole. (Patient taking differently: Take 325 mg by mouth daily. Swallow whole.)   atenolol (TENORMIN) 50 MG tablet Take 1 tablet (50 mg total) by mouth daily.   gabapentin (NEURONTIN) 300 MG capsule Take 300 mg by mouth at bedtime.   ibuprofen (ADVIL) 800 MG tablet Take 800 mg by mouth 3 (three) times daily as needed.   losartan (COZAAR) 100 MG tablet Take 1 tablet (100 mg total) by mouth daily.   prednisoLONE acetate (PRED FORTE) 1 % ophthalmic suspension Place 1 drop into the left eye daily.   rosuvastatin (CRESTOR) 20 MG tablet Take 1 tablet (20 mg total) by mouth daily.  timolol (TIMOPTIC) 0.5 % ophthalmic solution Place 1 drop into both eyes daily.   [DISCONTINUED] losartan (COZAAR) 50 MG tablet Take 1 tablet by mouth once daily     Allergies:   Patient has no known allergies.   Social History   Socioeconomic History   Marital status: Single    Spouse name: Not on file   Number of children: Not on file   Years of education: Not on file   Highest education level: Not on file  Occupational History   Not on file  Tobacco Use   Smoking status: Every Day   Smokeless tobacco: Never  Substance and Sexual Activity   Alcohol use: No   Drug use: No   Sexual activity: Not on file  Other Topics Concern   Not on file  Social History Narrative    Not on file   Social Determinants of Health   Financial Resource Strain: Not on file  Food Insecurity: Not on file  Transportation Needs: Not on file  Physical Activity: Not on file  Stress: Not on file  Social Connections: Unknown (07/26/2022)   Received from [facility]   Social Network    Social Network: Not on file     Family History: Father had stroke  ROS:   Please see the history of present illness.    All other systems reviewed and are negative.  EKGs/Labs/Other Studies Reviewed:    The following studies were reviewed today:   EKG:   06/29/21: Normal sinus rhythm, rate 84, no ST abnormalities 08/14/2020- The EKG ordered today demonstrates Sinus rhythm, Rate 92  02/18/2020- The ekg ordered demonstrates sinus rhythm, rate 76  Recent Labs: 10/18/2022: ALT 26; BUN 22; Creatinine, Ser 0.90; Hemoglobin 15.5; Platelets 262; Potassium 4.6; Sodium 143  Recent Lipid Panel    Component Value Date/Time   CHOL 161 10/18/2022 1057   TRIG 127 10/18/2022 1057   HDL 57 10/18/2022 1057   CHOLHDL 2.8 10/18/2022 1057   LDLCALC 82 10/18/2022 1057    Physical Exam:    VS:  BP (!) 134/100 (BP Location: Right Arm, Patient Position: Sitting, Cuff Size: Large)   Pulse 92   Ht 5\' 9"  (1.753 m)   Wt (!) 332 lb (150.6 kg)   SpO2 96%   BMI 49.03 kg/m     Wt Readings from Last 3 Encounters:  02/07/23 (!) 332 lb (150.6 kg)  10/18/22 (!) 305 lb 4.8 oz (138.5 kg)  07/13/22 282 lb 3 oz (128 kg)     GEN:  Well nourished, well developed in no acute distress HEENT: Normal NECK: No JVD; No carotid bruits CARDIAC: RRR, no murmurs, rubs, gallops RESPIRATORY:  Clear to auscultation without rales, wheezing or rhonchi  ABDOMEN: Soft, non-tender, non-distended MUSCULOSKELETAL:  1+ LE edema; No deformity  SKIN: Warm and dry NEUROLOGIC:  Alert and oriented x 3 PSYCHIATRIC:  Normal affect   TTE 03/13/19:  1. Left ventricular ejection fraction, by visual estimation, is 55 to   60%. The left ventricle has normal function. There is no left ventricular  hypertrophy.   2. The left ventricle has no regional wall motion abnormalities.   3. Global right ventricle has normal systolic function.The right  ventricular size is normal. No increase in right ventricular wall  thickness.   4. Left atrial size was normal.   5. Right atrial size was normal.   6. Presence of pericardial fat pad.   7. Trivial pericardial effusion is present.   8.  The mitral valve is grossly normal. Trivial mitral valve  regurgitation.   9. The tricuspid valve is grossly normal. Tricuspid valve regurgitation  is trivial.  10. The aortic valve is tricuspid. Aortic valve regurgitation is not  visualized. No evidence of aortic valve sclerosis or stenosis.  11. The pulmonic valve was grossly normal. Pulmonic valve regurgitation is  not visualized.  12. Normal pulmonary artery systolic pressure.  13. The tricuspid regurgitant velocity is 2.46 m/s, and with an assumed  right atrial pressure of 3 mmHg, the estimated right ventricular systolic  pressure is normal at 27.2 mmHg.  14. The inferior vena cava is normal in size with greater than 50%  respiratory variability, suggesting right atrial pressure of 3 mmHg.  15. No prior Echocardiogram.  16. The interatrial septum appears to be lipomatous.   Cardiac monitor 03/11/19: No significant arrhythmias   3 days of data recorded on Zio monitor. Patient had a min HR of 46 bpm, max HR of 132 bpm, and avg HR of 92 bpm. Predominant underlying rhythm was Sinus Rhythm. No VT, SVT, atrial fibrillation, high degree block, or pauses noted. Isolated atrial and ventricular ectopy was rare (<1%). There were no triggered events. No significant arrhythmias detected.  ASSESSMENT:    1. Coronary artery disease involving native coronary artery of native heart without angina pectoris   2. DOE (dyspnea on exertion)   3. Hyperlipidemia LDL goal <70   4. Hypertension,  unspecified type   5. Morbid obesity (HCC)   6. Tobacco use   7. Lower extremity edema      PLAN:    CAD: Reported atypical chest pain.  Coronary CTA on 11/28/21 showed minimal CAD (calcium score 1).  Echo 12/05/21 showed normal biventricular function, no significant valvular disease. -Continue rosuvastatin 20 mg daily  Hypertension: On atenolol 50 mg daily and amlodipine 10 mg daily and losartan 50 mg daily.  BP elevated in clinic today and reports elevated BP when checks at home.  Will increase losartan to 100 mg daily.  Check BMET in 1 week  Tachycardia: Zio patch showed no significant arrhythmias 02/2019  Dyspnea on exertion/ lower extremity edema: TTE shows no structural heart disease 11/2020.  Normal BNP, TSH, albumin.  Amlodipine use could be contributing to his edema, though edema preceded amlodipine -Reporting worsening dyspnea recently.  Suspect due to deconditioning following recent back surgery.  Will update echocardiogram -Also having worsening lower extremity edema.  Encouraged use of compression stockings.  Check lower extremity duplex to rule out DVT given recent surgery  Hyperlipidemia: LDL 153 on 02/18/2020.  Started rosuvastatin 10 mg daily.  LDL 82 on 10/16/2022.  Rosuvastatin increased to 20 mg daily, will check lipid panel\  Tobacco use: Smoking 2 packs per day. Patient counseled on risks of tobacco use and cessation strongly encouraged.    Alcohol use.  Previously was drinking over a pint of whiskey daily, cut back to drinking a fifth of whiskey per week.  Reports he is now drinking 6 pack of beer and 1 shot of whiskey daily.  Counseled on risks of alcohol use and recommended cessation.  OSA: Severe OSA on sleep study 07/2021, reports has had issues with CPAP and not using.  Will refer to sleep medicine  Morbid obesity: Body mass index is 49.03 kg/m.  Diet/exercise recommended.  Discussed referral to healthy weight and wellness but he declines.  Will refer to pharmacy  weight loss clinic to evaluate for [facility]  RTC in 6 months  Medication  Adjustments/Labs and Tests Ordered: Current medicines are reviewed at length with the patient today.  Concerns regarding medicines are outlined above.  Orders Placed This Encounter  Procedures   Lipid panel   B Nat Peptide   Comprehensive Metabolic Panel (CMET)   AMB Referral to [facility] [place] [facility]   Ambulatory referral to Pulmonology   ECHOCARDIOGRAM COMPLETE   VAS [place] LOWER EXTREMITY VENOUS (DVT)   Meds ordered this encounter  Medications   losartan (COZAAR) 100 MG tablet    Sig: Take 1 tablet (100 mg total) by mouth daily.    Dispense:  90 tablet    Refill:  3    Patient Instructions  Medication Instructions:  Start taking Losartan 100mg  daily  Stop taking Lorsartn 50mg  daily If you need a refill on your cardiac medications before your next appointment, please call your pharmacy*   Lab Work: CMET, Lipid panel, BNP, in  one week. You must be fasting If you have labs (blood work) drawn today and your tests are completely normal, you will receive your results only by: MyChart Message (if you have MyChart) OR A paper copy in the mail If you have any lab test that is abnormal or we need to change your treatment, we will call you to review the results.   Testing/Procedures: Echo  Your physician has requested that you have an echocardiogram. Echocardiography is a painless test that uses sound waves to create images of your heart. It provides your doctor with information about the size and shape of your heart and how well your heart's chambers and valves are working. This procedure takes approximately one hour. There are no restrictions for this procedure. Please do NOT wear cologne, perfume, aftershave, or lotions (deodorant is allowed). Please arrive 15 minutes prior to your appointment time.   Follow-Up: At [facility], you and your health needs are our priority.  As part of our continuing  mission to provide you with exceptional heart care, we have created designated Provider Care Teams.  These Care Teams include your primary Cardiologist (physician) and Advanced Practice Providers (APPs -  Physician Assistants and Nurse Practitioners) who all work together to provide you with the care you need, when you need it.  We recommend signing up for the patient portal called "MyChart".  Sign up information is provided on this After Visit Summary.  MyChart is used to connect with patients for Virtual Visits (Telemedicine).  Patients are able to view lab/test results, encounter notes, upcoming appointments, etc.  Non-urgent messages can be sent to your provider as well.   To learn more about what you can do with MyChart, go to [email or web address].    Your next appointment:   3 month(s)  Provider:   Little Ishikawa, MD     Other Instructions Please put compression stocking on in am and take off at bedtime   Please take blood pressure once daily and put in my chartand bring copy to your next visit.     Signed, Little Ishikawa, MD  02/07/2023 9:28 AM    [facility]

## 2023-02-07 ENCOUNTER — Encounter: Payer: Self-pay | Admitting: Cardiology

## 2023-02-07 ENCOUNTER — Ambulatory Visit: Payer: 59 | Attending: Cardiology | Admitting: Cardiology

## 2023-02-07 VITALS — BP 134/100 | HR 92 | Ht 69.0 in | Wt 332.0 lb

## 2023-02-07 DIAGNOSIS — R600 Localized edema: Secondary | ICD-10-CM

## 2023-02-07 DIAGNOSIS — Z720 Tobacco use: Secondary | ICD-10-CM

## 2023-02-07 DIAGNOSIS — I10 Essential (primary) hypertension: Secondary | ICD-10-CM

## 2023-02-07 DIAGNOSIS — I2510 Atherosclerotic heart disease of native coronary artery without angina pectoris: Secondary | ICD-10-CM

## 2023-02-07 DIAGNOSIS — R0609 Other forms of dyspnea: Secondary | ICD-10-CM

## 2023-02-07 DIAGNOSIS — G4733 Obstructive sleep apnea (adult) (pediatric): Secondary | ICD-10-CM

## 2023-02-07 DIAGNOSIS — E785 Hyperlipidemia, unspecified: Secondary | ICD-10-CM | POA: Diagnosis not present

## 2023-02-07 MED ORDER — LOSARTAN POTASSIUM 100 MG PO TABS: 100.0000 mg | ORAL_TABLET | Freq: Every day | ORAL | 3 refills | Status: DC

## 2023-02-07 NOTE — Patient Instructions (Addendum)
Medication Instructions:  Start taking Losartan 100mg  daily  Stop taking Lorsartn 50mg  daily If you need a refill on your cardiac medications before your next appointment, please call your pharmacy*   Lab Work: CMET, Lipid panel, BNP, in  one week. You must be fasting If you have labs (blood work) drawn today and your tests are completely normal, you will receive your results only by: MyChart Message (if you have MyChart) OR A paper copy in the mail If you have any lab test that is abnormal or we need to change your treatment, we will call you to review the results.   Testing/Procedures: Echo  Your physician has requested that you have an echocardiogram. Echocardiography is a painless test that uses sound waves to create images of your heart. It provides your doctor with information about the size and shape of your heart and how well your heart's chambers and valves are working. This procedure takes approximately one hour. There are no restrictions for this procedure. Please do NOT wear cologne, perfume, aftershave, or lotions (deodorant is allowed). Please arrive 15 minutes prior to your appointment time.   Follow-Up: At [facility], you and your health needs are our priority.  As part of our continuing mission to provide you with exceptional heart care, we have created designated Provider Care Teams.  These Care Teams include your primary Cardiologist (physician) and Advanced Practice Providers (APPs -  Physician Assistants and Nurse Practitioners) who all work together to provide you with the care you need, when you need it.  We recommend signing up for the patient portal called "MyChart".  Sign up information is provided on this After Visit Summary.  MyChart is used to connect with patients for Virtual Visits (Telemedicine).  Patients are able to view lab/test results, encounter notes, upcoming appointments, etc.  Non-urgent messages can be sent to your provider as well.   To  learn more about what you can do with MyChart, go to [email or web address].    Your next appointment:   3 month(s)  Provider:   Little Ishikawa, MD     Other Instructions Please put compression stocking on in am and take off at bedtime   Please take blood pressure once daily and put in my chartand bring copy to your next visit.

## 2023-02-07 NOTE — Addendum Note (Signed)
Addended by: Epifanio Lesches on: 02/07/2023 09:31 AM   Modules accepted: Orders

## 2023-02-07 NOTE — Addendum Note (Signed)
Addended by: Epifanio Lesches on: 02/07/2023 09:30 AM   Modules accepted: Orders

## 2023-02-14 ENCOUNTER — Other Ambulatory Visit: Payer: Self-pay | Admitting: Cardiology

## 2023-02-15 LAB — LIPID PANEL
Chol/HDL Ratio: 2.9 ratio (ref 0.0–5.0)
Cholesterol, Total: 152 mg/dL (ref 100–199)
HDL: 52 mg/dL (ref >39)
LDL Chol Calc (NIH): 72 mg/dL (ref 0–99)
Triglycerides: 163 mg/dL — ABNORMAL HIGH (ref 0–149)
VLDL Cholesterol Cal: 28 mg/dL (ref 5–40)

## 2023-02-15 LAB — COMPREHENSIVE METABOLIC PANEL
ALT: 28 [IU]/L (ref 0–44)
AST: 21 [IU]/L (ref 0–40)
Albumin: 4.1 g/dL (ref 4.1–5.1)
Alkaline Phosphatase: 133 [IU]/L — ABNORMAL HIGH (ref 44–121)
BUN/Creatinine Ratio: 23 — ABNORMAL HIGH (ref 9–20)
BUN: 20 mg/dL (ref 6–20)
Bilirubin Total: 0.5 mg/dL (ref 0.0–1.2)
CO2: 23 mmol/L (ref 20–29)
Calcium: 9.1 mg/dL (ref 8.7–10.2)
Chloride: 105 mmol/L (ref 96–106)
Creatinine, Ser: 0.86 mg/dL (ref 0.76–1.27)
Globulin, Total: 2.6 g/dL (ref 1.5–4.5)
Glucose: 101 mg/dL — ABNORMAL HIGH (ref 70–99)
Potassium: 5 mmol/L (ref 3.5–5.2)
Sodium: 142 mmol/L (ref 134–144)
Total Protein: 6.7 g/dL (ref 6.0–8.5)
eGFR: 113 mL/min/{1.73_m2} (ref >59)

## 2023-02-15 LAB — BRAIN NATRIURETIC PEPTIDE

## 2023-02-21 LAB — COMPREHENSIVE METABOLIC PANEL

## 2023-02-21 LAB — LIPID PANEL

## 2023-02-21 LAB — BRAIN NATRIURETIC PEPTIDE: BNP: 75.1 pg/mL (ref 0.0–100.0)

## 2023-03-07 ENCOUNTER — Ambulatory Visit (HOSPITAL_COMMUNITY)
Admission: RE | Admit: 2023-03-07 | Discharge: 2023-03-07 | Disposition: A | Discharge status: Home / Self Care | Payer: 59 | Source: Ambulatory Visit | Attending: Cardiovascular Disease | Admitting: Cardiovascular Disease

## 2023-03-07 DIAGNOSIS — R600 Localized edema: Secondary | ICD-10-CM | POA: Diagnosis not present

## 2023-03-14 ENCOUNTER — Ambulatory Visit: Payer: 59 | Attending: Cardiovascular Disease | Admitting: Pharmacist

## 2023-03-14 ENCOUNTER — Encounter: Payer: Self-pay | Admitting: Pharmacist

## 2023-03-14 ENCOUNTER — Telehealth: Payer: Self-pay | Admitting: Pharmacy Technician

## 2023-03-14 ENCOUNTER — Other Ambulatory Visit (HOSPITAL_COMMUNITY): Payer: Self-pay

## 2023-03-14 ENCOUNTER — Telehealth: Payer: Self-pay | Admitting: Pharmacist

## 2023-03-14 DIAGNOSIS — I700 Atherosclerosis of aorta: Secondary | ICD-10-CM

## 2023-03-14 DIAGNOSIS — I2510 Atherosclerotic heart disease of native coronary artery without angina pectoris: Secondary | ICD-10-CM | POA: Diagnosis not present

## 2023-03-14 DIAGNOSIS — E785 Hyperlipidemia, unspecified: Secondary | ICD-10-CM

## 2023-03-14 DIAGNOSIS — M5441 Lumbago with sciatica, right side: Secondary | ICD-10-CM | POA: Insufficient documentation

## 2023-03-14 NOTE — Progress Notes (Signed)
Patient ID: GISCARD NOUN                 DOB: 06/26/1985                    MRN: [id number]     HPI: Sean Page is a 37 y.o. male patient referred to pharmacy clinic by Dr Bjorn Pippin to initiate GLP1-RA therapy. PMH is significant for aortic atherosclerosis, HTN, migraines, EtOH abuse, smoking, and obesity. Most recent BMI 49.33 kg/m2.  Patient presents today to discuss weight loss. Has been out of work since April 2024 due to back injury suffered at work. Currently on workers comp. Reports weight has been continuously increasing.  Has inconsistent appetite. Will go a full day without eating and then the next day have large meals.  Does not have any foods he does not care for. Has trouble being physically active due to pain. Is hopeful with weight loss he can be more physical and also return to work.  Last A1c 5.9%  Labs: Lab Results  Component Value Date   HGBA1C 5.9 (H) 10/18/2022    Wt Readings from Last 1 Encounters:  02/07/23 (!) 332 lb (150.6 kg)    BP Readings from Last 1 Encounters:  02/07/23 (!) 134/100   Pulse Readings from Last 1 Encounters:  02/07/23 92       Component Value Date/Time   CHOL 152 02/14/2023 0000   CHOL CANCELED 02/14/2023 0000   TRIG 163 (H) 02/14/2023 0000   TRIG CANCELED 02/14/2023 0000   HDL 52 02/14/2023 0000   HDL CANCELED 02/14/2023 0000   CHOLHDL 2.9 02/14/2023 0000   LDLCALC 72 02/14/2023 0000    Past Medical History:  Diagnosis Date   Glaucoma    Hypertension    Migraine     Current Outpatient Medications on File Prior to Visit  Medication Sig Dispense Refill   albuterol (VENTOLIN HFA) 108 (90 Base) MCG/ACT inhaler Inhale 2 puffs into the lungs every 6 (six) hours as needed for wheezing or shortness of breath.     amLODipine (NORVASC) 10 MG tablet Take 1 tablet by mouth once daily 90 tablet 3   aspirin EC 81 MG tablet Take 1 tablet (81 mg total) by mouth daily. Swallow whole. (Patient taking differently: Take 325  mg by mouth daily. Swallow whole.) 90 tablet 3   atenolol (TENORMIN) 50 MG tablet Take 1 tablet (50 mg total) by mouth daily. 90 tablet 1   gabapentin (NEURONTIN) 300 MG capsule Take 300 mg by mouth at bedtime.     ibuprofen (ADVIL) 800 MG tablet Take 800 mg by mouth 3 (three) times daily as needed.     losartan (COZAAR) 100 MG tablet Take 1 tablet (100 mg total) by mouth daily. 90 tablet 3   prednisoLONE acetate (PRED FORTE) 1 % ophthalmic suspension Place 1 drop into the left eye daily.     rosuvastatin (CRESTOR) 20 MG tablet Take 1 tablet (20 mg total) by mouth daily. 90 tablet 3   sodium chloride (MURO 128) 5 % ophthalmic ointment Place 1 Application into the left eye.     timolol (TIMOPTIC) 0.5 % ophthalmic solution Place 1 drop into both eyes daily.     No current facility-administered medications on file prior to visit.    No Known Allergies   Assessment/Plan:  1. Weight loss - Patient's last BMI 49.33 kg/m2 which places patient in Class III obesity category. Patient would benefit from weight loss therapy.  Using Zepbound and [employer], educated on mechanism of action, storage, site selection, administration, and possible adverse effects. Patient reports no history of medullary thyroid carcinoma (MTC) or Multiple Endocrine Neoplasia syndrome type 2 (MEN 2). Injection technique reviewed at today's visit.  Advised patient on common side effects including nausea, diarrhea, dyspepsia, decreased appetite, and fatigue. Counseled patient on reducing meal size and how to titrate medication to minimize side effects. Counseled patient to call if intolerable side effects or if experiencing dehydration, abdominal pain, or dizziness. Patient will adhere to dietary modifications and will target at least 150 minutes of moderate intensity exercise weekly.   Will complete prior authorizations and contact patient with result.  Laural Golden, PharmD, BCACP, CDCES, CPP [address] [place], [place], [address] Phone: [phone], Fax: [phone]

## 2023-03-14 NOTE — Telephone Encounter (Signed)
Please complete PA for [facility] and/or Zepbound

## 2023-03-14 NOTE — Telephone Encounter (Signed)
Pharmacy Patient Advocate Encounter   Received notification from Pt Calls Messages that prior authorization for wegovy is required/requested.   Insurance verification completed.   The patient is insured through [facility] .   Per test claim: PA required; PA submitted to above mentioned insurance via CoverMyMeds Key/confirmation #/EOC [id number] Status is pending

## 2023-03-14 NOTE — Patient Instructions (Signed)
It was nice meeting you today  The medications we discussed today are called Wegovy and Zepbound. Both are injections you would take once weekly  I will complete the prior authorizations for you and contact you with the result  I recommend concentrating on vegetables, lean proteins, and whole grains  Try to avoid foods high in fat and sugars  Please let us know if you have any questions  Laural Golden, PharmD, BCACP, CDCES, CPP [address], [address] [place], [place], [address] Phone: [phone], Fax: [phone]

## 2023-03-14 NOTE — Telephone Encounter (Signed)
Pharmacy Patient Advocate Encounter  Received notification from [facility] that Prior Authorization for wegovy has been APPROVED from 03/14/23 to 07/13/23. Ran test claim, Copay is $24.99 one month. This test claim was processed through [facility]- copay amounts may vary at other pharmacies due to pharmacy/plan contracts, or as the patient moves through the different stages of their insurance plan.   PA #/Case ID/Reference #: [id number]

## 2023-03-15 ENCOUNTER — Other Ambulatory Visit (HOSPITAL_COMMUNITY): Payer: Self-pay

## 2023-03-15 MED ORDER — WEGOVY 0.25 MG/0.5ML ~~LOC~~ SOAJ
0.2500 mg | SUBCUTANEOUS | 0 refills | Status: DC
  Filled 2023-03-15: qty 2, 28d supply, fill #0

## 2023-03-15 NOTE — Addendum Note (Signed)
Addended by: Cheree Ditto on: 03/15/2023 02:53 PM   Modules accepted: Orders

## 2023-03-21 ENCOUNTER — Ambulatory Visit (HOSPITAL_COMMUNITY): Payer: 59 | Attending: Cardiology

## 2023-03-21 ENCOUNTER — Other Ambulatory Visit (HOSPITAL_COMMUNITY): Payer: Self-pay

## 2023-03-21 DIAGNOSIS — R600 Localized edema: Secondary | ICD-10-CM | POA: Insufficient documentation

## 2023-03-21 DIAGNOSIS — I10 Essential (primary) hypertension: Secondary | ICD-10-CM | POA: Diagnosis present

## 2023-03-21 DIAGNOSIS — I2510 Atherosclerotic heart disease of native coronary artery without angina pectoris: Secondary | ICD-10-CM | POA: Insufficient documentation

## 2023-03-21 DIAGNOSIS — E785 Hyperlipidemia, unspecified: Secondary | ICD-10-CM | POA: Diagnosis present

## 2023-03-21 DIAGNOSIS — R0609 Other forms of dyspnea: Secondary | ICD-10-CM | POA: Insufficient documentation

## 2023-03-21 LAB — ECHOCARDIOGRAM COMPLETE
Area-P 1/2: 5.27 cm2
S' Lateral: 3 cm

## 2023-04-06 ENCOUNTER — Other Ambulatory Visit (HOSPITAL_COMMUNITY): Payer: Self-pay

## 2023-04-06 MED ORDER — WEGOVY 0.5 MG/0.5ML ~~LOC~~ SOAJ
0.5000 mg | SUBCUTANEOUS | 0 refills | Status: DC
  Filled 2023-04-06: qty 2, 28d supply, fill #0

## 2023-04-06 NOTE — Addendum Note (Signed)
 Addended by: Cheree Ditto on: 04/06/2023 07:22 AM   Modules accepted: Orders

## 2023-04-07 ENCOUNTER — Other Ambulatory Visit (HOSPITAL_COMMUNITY): Payer: Self-pay

## 2023-04-22 ENCOUNTER — Other Ambulatory Visit (HOSPITAL_COMMUNITY): Payer: Self-pay

## 2023-05-09 ENCOUNTER — Encounter: Payer: Self-pay | Admitting: Pharmacist

## 2023-05-09 ENCOUNTER — Other Ambulatory Visit (HOSPITAL_COMMUNITY): Payer: Self-pay

## 2023-05-09 MED ORDER — WEGOVY 1 MG/0.5ML ~~LOC~~ SOAJ
1.0000 mg | SUBCUTANEOUS | 0 refills | Status: DC
  Filled 2023-05-09: qty 2, 28d supply, fill #0

## 2023-06-06 ENCOUNTER — Other Ambulatory Visit (HOSPITAL_COMMUNITY): Payer: Self-pay

## 2023-06-06 ENCOUNTER — Other Ambulatory Visit: Payer: Self-pay | Admitting: Cardiology

## 2023-06-06 ENCOUNTER — Encounter: Payer: Self-pay | Admitting: Pharmacist

## 2023-06-06 DIAGNOSIS — I2510 Atherosclerotic heart disease of native coronary artery without angina pectoris: Secondary | ICD-10-CM

## 2023-06-06 DIAGNOSIS — I10 Essential (primary) hypertension: Secondary | ICD-10-CM

## 2023-06-06 MED ORDER — WEGOVY 1.7 MG/0.75ML ~~LOC~~ SOAJ
1.7000 mg | SUBCUTANEOUS | 0 refills | Status: DC
  Filled 2023-06-06: qty 3, 28d supply, fill #0

## 2023-06-07 ENCOUNTER — Other Ambulatory Visit (HOSPITAL_COMMUNITY): Payer: Self-pay

## 2023-06-12 NOTE — Progress Notes (Unsigned)
 Cardiology Office Note:    Date:  06/15/2023   ID:  Sean Page, DOB 04/19/1985, MRN [id number]  PCP:  Dennie Maizes, NP (Inactive)  Cardiologist:  Little Ishikawa, MD  Electrophysiologist:  None   Referring MD: No ref. provider found   Chief Complaint  Patient presents with   Coronary Artery Disease    History of Present Illness:    Sean Page is a 38 y.o. male with a hx of migraines, hypertension, tobacco use, alcohol use presents for a follow-up visit.  He was initially seen on 02/01/2019 for an evaluation of tachycardia.  He denies any palpitations or chest pain.  Does report some dyspnea on exertion.  Has also noted lower extremity edema.  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  Since that time reports he has not been working and has been smoking and drinking more.  He was smoking 1.5 to 2 packs/day.  Has smoked for 20 years.  States that he was drinking whiskey, drinks over a pint per day.  Started on atenolol 25 mg daily a week prior for hypertension.    At initial clinic visit, atenolol was increased to 50 mg daily.  TTE was ordered given his dyspnea on exertion, which showed no significant abnormalities.  Given his tachycardia, a Zio patch x3 days was ordered, which showed no significant arrhythmias.  Coronary CTA on 11/28/21 showed minimal CAD (calcium score 1).  Echo 12/05/21 showed normal biventricular function, no significant valvular disease.  Echocardiogram 02/2023 showed normal biventricular function, no significant valvular disease.  Since last clinic visit, he reports he is doing better.  He has lost 22 pounds in last 4 months.  He is on [employer].  He reports occasional chest tightness and shortness of breath but has improved.  He is back to work and walking a lot at his job.  Reports his back pain is improved.  He has cut back to drinking just 2 nights a week, will drink about a pint of whiskey.  He is smoking 1.5 packs/day.   He continues to have lower extremity edema.  He denies any palpitations, lightheadedness, or syncope.   BP Readings from Last 3 Encounters:  06/13/23 114/68  02/07/23 (!) 134/100  10/18/22 (!) 130/94   Wt Readings from Last 3 Encounters:  06/13/23 (!) 310 lb (140.6 kg)  03/14/23 (!) 334 lb 3.2 oz (151.6 kg)  02/07/23 (!) 332 lb (150.6 kg)     Past Medical History:  Diagnosis Date   Glaucoma    Hypertension    Migraine     No past surgical history on file.  Current Medications: Current Meds  Medication Sig   amLODipine (NORVASC) 10 MG tablet Take 1 tablet by mouth once daily   aspirin EC 81 MG tablet Take 1 tablet (81 mg total) by mouth daily. Swallow whole. (Patient taking differently: Take 325 mg by mouth daily. Swallow whole.)   atenolol (TENORMIN) 50 MG tablet Take 1 tablet (50 mg total) by mouth daily.   gabapentin (NEURONTIN) 300 MG capsule Take 300 mg by mouth at bedtime.   ibuprofen (ADVIL) 800 MG tablet Take 800 mg by mouth 3 (three) times daily as needed.   losartan (COZAAR) 100 MG tablet Take 1 tablet (100 mg total) by mouth daily.   prednisoLONE acetate (PRED FORTE) 1 % ophthalmic suspension Place 1 drop into the left eye daily.   rosuvastatin (CRESTOR) 20 MG tablet Take 1 tablet (  20 mg total) by mouth daily.   Semaglutide-Weight Management (WEGOVY) 1.7 MG/0.75ML SOAJ Inject 1.7 mg into the skin once a week.   sodium chloride (MURO 128) 5 % ophthalmic ointment Place 1 Application into the left eye.   timolol (TIMOPTIC) 0.5 % ophthalmic solution Place 1 drop into both eyes daily.     Allergies:   Patient has no known allergies.   Social History   Socioeconomic History   Marital status: Single    Spouse name: Not on file   Number of children: Not on file   Years of education: Not on file   Highest education level: Not on file  Occupational History   Not on file  Tobacco Use   Smoking status: Every Day   Smokeless tobacco: Never   Tobacco comments:     06/13/2023 Patient smokes 1 1/2 pack of cigarettes daily  Substance and Sexual Activity   Alcohol use: No   Drug use: No   Sexual activity: Not on file  Other Topics Concern   Not on file  Social History Narrative   Not on file   Social Drivers of Health   Financial Resource Strain: Not on file  Food Insecurity: Not on file  Transportation Needs: Not on file  Physical Activity: Not on file  Stress: Not on file  Social Connections: Unknown (07/26/2022)   Received from [facility]   Social Network    Social Network: Not on file     Family History: Father had stroke  ROS:   Please see the history of present illness.    All other systems reviewed and are negative.  EKGs/Labs/Other Studies Reviewed:    The following studies were reviewed today:   EKG:   06/13/2023: Normal sinus rhythm, rate 95 06/29/21: Normal sinus rhythm, rate 84, no ST abnormalities 08/14/2020- The EKG ordered today demonstrates Sinus rhythm, Rate 92  02/18/2020- The ekg ordered demonstrates sinus rhythm, rate 76  Recent Labs: 10/18/2022: Hemoglobin 15.5; Platelets 262 02/14/2023: ALT 28; ALT CANCELED; BNP CANCELED; BNP 75.1; BUN 20; BUN CANCELED; Creatinine, Ser 0.86; Creatinine, Ser CANCELED; Potassium 5.0; Potassium CANCELED; Sodium 142; Sodium CANCELED  Recent Lipid Panel    Component Value Date/Time   CHOL 152 02/14/2023 0000   CHOL CANCELED 02/14/2023 0000   TRIG 163 (H) 02/14/2023 0000   TRIG CANCELED 02/14/2023 0000   HDL 52 02/14/2023 0000   HDL CANCELED 02/14/2023 0000   CHOLHDL 2.9 02/14/2023 0000   LDLCALC 72 02/14/2023 0000    Physical Exam:    VS:  BP 114/68 (BP Location: Left Arm, Patient Position: Sitting)   Pulse 95   Ht 5\' 10"  (1.778 m)   Wt (!) 310 lb (140.6 kg)   SpO2 92%   BMI 44.48 kg/m     Wt Readings from Last 3 Encounters:  06/13/23 (!) 310 lb (140.6 kg)  03/14/23 (!) 334 lb 3.2 oz (151.6 kg)  02/07/23 (!) 332 lb (150.6 kg)     GEN:  Well  nourished, well developed in no acute distress HEENT: Normal NECK: No JVD; No carotid bruits CARDIAC: RRR, no murmurs, rubs, gallops RESPIRATORY:  Clear to auscultation without rales, wheezing or rhonchi  ABDOMEN: Soft, non-tender, non-distended MUSCULOSKELETAL:  1+ LE edema; No deformity  SKIN: Warm and dry NEUROLOGIC:  Alert and oriented x 3 PSYCHIATRIC:  Normal affect   TTE 03/13/19:  1. Left ventricular ejection fraction, by visual estimation, is 55 to  60%. The left ventricle has normal  function. There is no left ventricular  hypertrophy.   2. The left ventricle has no regional wall motion abnormalities.   3. Global right ventricle has normal systolic function.The right  ventricular size is normal. No increase in right ventricular wall  thickness.   4. Left atrial size was normal.   5. Right atrial size was normal.   6. Presence of pericardial fat pad.   7. Trivial pericardial effusion is present.   8. The mitral valve is grossly normal. Trivial mitral valve  regurgitation.   9. The tricuspid valve is grossly normal. Tricuspid valve regurgitation  is trivial.  10. The aortic valve is tricuspid. Aortic valve regurgitation is not  visualized. No evidence of aortic valve sclerosis or stenosis.  11. The pulmonic valve was grossly normal. Pulmonic valve regurgitation is  not visualized.  12. Normal pulmonary artery systolic pressure.  13. The tricuspid regurgitant velocity is 2.46 m/s, and with an assumed  right atrial pressure of 3 mmHg, the estimated right ventricular systolic  pressure is normal at 27.2 mmHg.  14. The inferior vena cava is normal in size with greater than 50%  respiratory variability, suggesting right atrial pressure of 3 mmHg.  15. No prior Echocardiogram.  16. The interatrial septum appears to be lipomatous.   Cardiac monitor 03/11/19: No significant arrhythmias   3 days of data recorded on Zio monitor. Patient had a min HR of 46 bpm, max HR of 132  bpm, and avg HR of 92 bpm. Predominant underlying rhythm was Sinus Rhythm. No VT, SVT, atrial fibrillation, high degree block, or pauses noted. Isolated atrial and ventricular ectopy was rare (<1%). There were no triggered events. No significant arrhythmias detected.  ASSESSMENT:    1. Coronary artery disease involving native coronary artery of native heart without angina pectoris   2. DOE (dyspnea on exertion)   3. Hypertension, unspecified type   4. Tachycardia   5. Tobacco use   6. Alcohol use   7. Morbid obesity (HCC)       PLAN:    CAD: Reported atypical chest pain.  Coronary CTA on 11/28/21 showed minimal CAD (calcium score 1).  Echocardiogram 02/2023 showed normal biventricular function, no significant valvular disease. -Continue rosuvastatin 20 mg daily  Hypertension: On atenolol 50 mg daily and amlodipine 10 mg daily and losartan 100 mg daily.  Appears controlled  Tachycardia: Zio patch showed no significant arrhythmias 02/2019  Dyspnea on exertion/ lower extremity edema: TTE shows no structural heart disease 11/2020.  Normal BNP, TSH, albumin.  Amlodipine use could be contributing to his edema, though edema preceded amlodipine.  He reported worsening dyspnea at appointment 01/2023, suspect due to deconditioning following back surgery.  Echocardiogram 02/2023 showed normal biventricular function, no significant valvular disease.  Also having worsening lower extremity edema.  Encouraged use of compression stockings.  LE duplex checked, which showed no DVT 02/2023 -Reports symptoms have improved, no further cardiac workup recommended at this time  Hyperlipidemia: LDL 153 on 02/18/2020.  Started rosuvastatin 10 mg daily.  LDL 82 on 10/16/2022.  Rosuvastatin increased to 20 mg daily, LDL 72 on 02/14/2023  Tobacco use: Smoking 1.5 packs per day. Patient counseled on risks of tobacco use and cessation strongly encouraged.    Alcohol use.  Previously was drinking over a pint of whiskey  daily, cut back to drinking a fifth of whiskey per week.  Reports he is now drinking 2 nights per week, will drink about a pint of whiskey.  Counseled on risks  of alcohol use and recommended cessation.  OSA: Severe OSA on sleep study 07/2021, reports has had issues with CPAP and not using.  Referred to sleep medicine  Morbid obesity: Body mass index is 44.48 kg/m.  Diet/exercise recommended.  Discussed referral to healthy weight and wellness but he declined.  Referred to pharmacy and started on Wegovy.  Has lost 22 pounds in last 4 months  RTC in 6 months  Medication Adjustments/Labs and Tests Ordered: Current medicines are reviewed at length with the patient today.  Concerns regarding medicines are outlined above.  Orders Placed This Encounter  Procedures   EKG 12-Lead   No orders of the defined types were placed in this encounter.   Patient Instructions  Medication Instructions:  Your physician recommends that you continue on your current medications as directed. Please refer to the Current Medication list given to you today.  *If you need a refill on your cardiac medications before your next appointment, please call your pharmacy*   Follow-Up: At [facility], you and your health needs are our priority.  As part of our continuing mission to provide you with exceptional heart care, we have created designated Provider Care Teams.  These Care Teams include your primary Cardiologist (physician) and Advanced Practice Providers (APPs -  Physician Assistants and Nurse Practitioners) who all work together to provide you with the care you need, when you need it.   Your next appointment:   6 month(s)  Provider:   Little Ishikawa, MD     Other Instructions            Signed, Little Ishikawa, MD  06/15/2023 9:41 AM    [facility]

## 2023-06-13 ENCOUNTER — Encounter: Payer: Self-pay | Admitting: Cardiology

## 2023-06-13 ENCOUNTER — Ambulatory Visit: Payer: 59 | Attending: Cardiology | Admitting: Cardiology

## 2023-06-13 VITALS — BP 114/68 | HR 95 | Ht 70.0 in | Wt 310.0 lb

## 2023-06-13 DIAGNOSIS — I2510 Atherosclerotic heart disease of native coronary artery without angina pectoris: Secondary | ICD-10-CM

## 2023-06-13 DIAGNOSIS — I10 Essential (primary) hypertension: Secondary | ICD-10-CM | POA: Diagnosis not present

## 2023-06-13 DIAGNOSIS — R000 Tachycardia, unspecified: Secondary | ICD-10-CM

## 2023-06-13 DIAGNOSIS — Z789 Other specified health status: Secondary | ICD-10-CM

## 2023-06-13 DIAGNOSIS — R0609 Other forms of dyspnea: Secondary | ICD-10-CM | POA: Diagnosis not present

## 2023-06-13 DIAGNOSIS — Z720 Tobacco use: Secondary | ICD-10-CM

## 2023-06-13 NOTE — Patient Instructions (Signed)
 Medication Instructions:  Your physician recommends that you continue on your current medications as directed. Please refer to the Current Medication list given to you today.  *If you need a refill on your cardiac medications before your next appointment, please call your pharmacy*   Follow-Up: At [facility], you and your health needs are our priority.  As part of our continuing mission to provide you with exceptional heart care, we have created designated Provider Care Teams.  These Care Teams include your primary Cardiologist (physician) and Advanced Practice Providers (APPs -  Physician Assistants and Nurse Practitioners) who all work together to provide you with the care you need, when you need it.   Your next appointment:   6 month(s)  Provider:   Little Ishikawa, MD     Other Instructions

## 2023-07-08 ENCOUNTER — Encounter: Payer: Self-pay | Admitting: Pharmacist

## 2023-07-08 DIAGNOSIS — Z6841 Body Mass Index (BMI) 40.0 and over, adult: Secondary | ICD-10-CM

## 2023-07-11 ENCOUNTER — Other Ambulatory Visit (HOSPITAL_COMMUNITY): Payer: Self-pay

## 2023-07-11 MED ORDER — WEGOVY 2.4 MG/0.75ML ~~LOC~~ SOAJ
2.4000 mg | SUBCUTANEOUS | 5 refills | Status: DC
  Filled 2023-07-11: qty 3, 28d supply, fill #0
  Filled 2023-08-08: qty 3, 28d supply, fill #1
  Filled 2023-09-03: qty 3, 28d supply, fill #2
  Filled 2023-09-30: qty 3, 28d supply, fill #3
  Filled 2023-10-28: qty 3, 28d supply, fill #4
  Filled 2023-11-25: qty 3, 28d supply, fill #5

## 2023-08-09 ENCOUNTER — Other Ambulatory Visit (HOSPITAL_COMMUNITY): Payer: Self-pay

## 2023-08-09 ENCOUNTER — Telehealth: Payer: Self-pay

## 2023-08-09 NOTE — Telephone Encounter (Signed)
 Pharmacy Patient Advocate Encounter   Received notification from CoverMyMeds that prior authorization for WEGOVY  is required/requested.   Insurance verification completed.   The patient is insured through [facility] .   Per test claim: PA required; PA submitted to above mentioned insurance via CoverMyMeds Key/confirmation #/EOC [id number] Status is pending

## 2023-08-09 NOTE — Telephone Encounter (Signed)
 Pharmacy Patient Advocate Encounter  Received notification from [employer] that Prior Authorization for WEGOVY  has been APPROVED from 08/09/23 to 08/08/24

## 2023-08-10 ENCOUNTER — Other Ambulatory Visit: Payer: Self-pay

## 2023-09-23 ENCOUNTER — Other Ambulatory Visit (HOSPITAL_BASED_OUTPATIENT_CLINIC_OR_DEPARTMENT_OTHER): Payer: Self-pay | Admitting: Family

## 2023-09-23 DIAGNOSIS — E785 Hyperlipidemia, unspecified: Secondary | ICD-10-CM

## 2023-09-23 DIAGNOSIS — I2510 Atherosclerotic heart disease of native coronary artery without angina pectoris: Secondary | ICD-10-CM

## 2023-10-22 ENCOUNTER — Other Ambulatory Visit: Payer: Self-pay | Admitting: Cardiology

## 2023-11-29 ENCOUNTER — Other Ambulatory Visit: Payer: Self-pay

## 2023-12-01 ENCOUNTER — Encounter: Payer: Self-pay | Admitting: Cardiology

## 2023-12-12 ENCOUNTER — Other Ambulatory Visit: Payer: Self-pay

## 2023-12-17 ENCOUNTER — Other Ambulatory Visit (HOSPITAL_BASED_OUTPATIENT_CLINIC_OR_DEPARTMENT_OTHER): Payer: Self-pay | Admitting: Family

## 2023-12-17 DIAGNOSIS — I2510 Atherosclerotic heart disease of native coronary artery without angina pectoris: Secondary | ICD-10-CM

## 2023-12-17 DIAGNOSIS — E785 Hyperlipidemia, unspecified: Secondary | ICD-10-CM

## 2023-12-26 ENCOUNTER — Other Ambulatory Visit (HOSPITAL_COMMUNITY): Payer: Self-pay

## 2023-12-26 ENCOUNTER — Other Ambulatory Visit: Payer: Self-pay | Admitting: Pharmacist

## 2023-12-26 DIAGNOSIS — Z6841 Body Mass Index (BMI) 40.0 and over, adult: Secondary | ICD-10-CM

## 2023-12-26 MED ORDER — WEGOVY 2.4 MG/0.75ML ~~LOC~~ SOAJ
2.4000 mg | SUBCUTANEOUS | 5 refills | Status: AC
  Filled 2023-12-26: qty 3, 28d supply, fill #0
  Filled 2024-01-23: qty 3, 28d supply, fill #1
  Filled 2024-02-18: qty 3, 28d supply, fill #2
  Filled 2024-03-16: qty 3, 28d supply, fill #3
  Filled 2024-04-09 – 2024-04-13 (×2): qty 3, 28d supply, fill #4

## 2024-01-16 ENCOUNTER — Other Ambulatory Visit: Payer: Self-pay | Admitting: Cardiology

## 2024-01-16 DIAGNOSIS — R0609 Other forms of dyspnea: Secondary | ICD-10-CM

## 2024-01-16 DIAGNOSIS — R600 Localized edema: Secondary | ICD-10-CM

## 2024-01-16 DIAGNOSIS — E785 Hyperlipidemia, unspecified: Secondary | ICD-10-CM

## 2024-01-16 DIAGNOSIS — I10 Essential (primary) hypertension: Secondary | ICD-10-CM

## 2024-01-16 DIAGNOSIS — I2510 Atherosclerotic heart disease of native coronary artery without angina pectoris: Secondary | ICD-10-CM

## 2024-02-14 ENCOUNTER — Other Ambulatory Visit: Payer: Self-pay | Admitting: Cardiology

## 2024-02-14 DIAGNOSIS — I2510 Atherosclerotic heart disease of native coronary artery without angina pectoris: Secondary | ICD-10-CM

## 2024-02-14 DIAGNOSIS — I10 Essential (primary) hypertension: Secondary | ICD-10-CM

## 2024-04-10 ENCOUNTER — Other Ambulatory Visit (HOSPITAL_COMMUNITY): Payer: Self-pay

## 2024-04-13 ENCOUNTER — Other Ambulatory Visit (HOSPITAL_COMMUNITY): Payer: Self-pay

## 2024-04-19 ENCOUNTER — Other Ambulatory Visit (HOSPITAL_COMMUNITY): Payer: Self-pay
